# Patient Record
Sex: Male | Born: 1950 | Race: White | Hispanic: No | Marital: Married | Smoking: Former smoker
Health system: Southern US, Community
[De-identification: ages and names within clinical notes are randomized; demographics above are authoritative.]

## PROBLEM LIST (undated history)

## (undated) DIAGNOSIS — G2581 Restless legs syndrome: Secondary | ICD-10-CM

## (undated) DIAGNOSIS — I10 Essential (primary) hypertension: Secondary | ICD-10-CM

## (undated) DIAGNOSIS — F5221 Male erectile disorder: Secondary | ICD-10-CM

## (undated) DIAGNOSIS — Z87438 Personal history of other diseases of male genital organs: Secondary | ICD-10-CM

## (undated) DIAGNOSIS — N400 Enlarged prostate without lower urinary tract symptoms: Secondary | ICD-10-CM

## (undated) DIAGNOSIS — M545 Low back pain, unspecified: Secondary | ICD-10-CM

## (undated) DIAGNOSIS — R413 Other amnesia: Secondary | ICD-10-CM

## (undated) DIAGNOSIS — R42 Dizziness and giddiness: Secondary | ICD-10-CM

## (undated) DIAGNOSIS — G479 Sleep disorder, unspecified: Secondary | ICD-10-CM

## (undated) DIAGNOSIS — R7301 Impaired fasting glucose: Secondary | ICD-10-CM

## (undated) DIAGNOSIS — I998 Other disorder of circulatory system: Secondary | ICD-10-CM

## (undated) DIAGNOSIS — U071 COVID-19: Secondary | ICD-10-CM

## (undated) DIAGNOSIS — E7800 Pure hypercholesterolemia, unspecified: Secondary | ICD-10-CM

## (undated) DIAGNOSIS — M1990 Unspecified osteoarthritis, unspecified site: Secondary | ICD-10-CM

## (undated) DIAGNOSIS — E038 Other specified hypothyroidism: Secondary | ICD-10-CM

## (undated) DIAGNOSIS — F419 Anxiety disorder, unspecified: Secondary | ICD-10-CM

## (undated) DIAGNOSIS — F321 Major depressive disorder, single episode, moderate: Secondary | ICD-10-CM

## (undated) DIAGNOSIS — R251 Tremor, unspecified: Secondary | ICD-10-CM

## (undated) DIAGNOSIS — K5792 Diverticulitis of intestine, part unspecified, without perforation or abscess without bleeding: Secondary | ICD-10-CM

## (undated) HISTORY — DX: Other amnesia: R41.3

## (undated) HISTORY — DX: Anxiety disorder, unspecified: F41.9

## (undated) HISTORY — DX: Male erectile disorder: F52.21

## (undated) HISTORY — DX: Impaired fasting glucose: R73.01

## (undated) HISTORY — DX: Other specified hypothyroidism: E03.8

## (undated) HISTORY — DX: Sleep disorder, unspecified: G47.9

## (undated) HISTORY — DX: Benign prostatic hyperplasia without lower urinary tract symptoms: N40.0

## (undated) HISTORY — DX: Restless legs syndrome: G25.81

## (undated) HISTORY — DX: COVID-19: U07.1

## (undated) HISTORY — DX: Personal history of other diseases of male genital organs: Z87.438

## (undated) HISTORY — DX: Pure hypercholesterolemia, unspecified: E78.00

## (undated) HISTORY — DX: Other disorder of circulatory system: I99.8

## (undated) HISTORY — DX: Dizziness and giddiness: R42

## (undated) HISTORY — DX: Major depressive disorder, single episode, moderate: F32.1

## (undated) HISTORY — DX: Essential (primary) hypertension: I10

## (undated) HISTORY — DX: Diverticulitis of intestine, part unspecified, without perforation or abscess without bleeding: K57.92

## (undated) HISTORY — DX: Low back pain, unspecified: M54.50

## (undated) HISTORY — DX: Tremor, unspecified: R25.1

---

## 2012-02-01 ENCOUNTER — Emergency Department (HOSPITAL_COMMUNITY)
Admission: EM | Admit: 2012-02-01 | Discharge: 2012-02-02 | Disposition: A | Discharge status: Home / Self Care | Payer: BC Managed Care – PPO | Attending: Emergency Medicine | Admitting: Emergency Medicine

## 2012-02-01 ENCOUNTER — Encounter (HOSPITAL_COMMUNITY): Payer: Self-pay | Admitting: *Deleted

## 2012-02-01 DIAGNOSIS — Y929 Unspecified place or not applicable: Secondary | ICD-10-CM | POA: Insufficient documentation

## 2012-02-01 DIAGNOSIS — Z23 Encounter for immunization: Secondary | ICD-10-CM | POA: Insufficient documentation

## 2012-02-01 DIAGNOSIS — IMO0001 Reserved for inherently not codable concepts without codable children: Secondary | ICD-10-CM | POA: Insufficient documentation

## 2012-02-01 DIAGNOSIS — S61209A Unspecified open wound of unspecified finger without damage to nail, initial encounter: Secondary | ICD-10-CM | POA: Insufficient documentation

## 2012-02-01 DIAGNOSIS — Y9389 Activity, other specified: Secondary | ICD-10-CM | POA: Insufficient documentation

## 2012-02-01 DIAGNOSIS — W5581XA Bitten by other mammals, initial encounter: Secondary | ICD-10-CM

## 2012-02-01 MED ORDER — TETANUS-DIPHTH-ACELL PERTUSSIS 5-2.5-18.5 LF-MCG/0.5 IM SUSP
0.5000 mL | Freq: Once | INTRAMUSCULAR | Status: AC
  Administered 2012-02-02: 0.5 mL via INTRAMUSCULAR
  Filled 2012-02-01: qty 0.5

## 2012-02-01 MED ORDER — RABIES IMMUNE GLOBULIN 150 UNIT/ML IM INJ
20.0000 [IU]/kg | INJECTION | Freq: Once | INTRAMUSCULAR | Status: AC
  Administered 2012-02-02: 1800 [IU] via INTRAMUSCULAR
  Filled 2012-02-01: qty 12

## 2012-02-01 MED ORDER — RABIES VACCINE, PCEC IM SUSR
1.0000 mL | Freq: Once | INTRAMUSCULAR | Status: AC
  Administered 2012-02-02: 1 mL via INTRAMUSCULAR
  Filled 2012-02-01: qty 1

## 2012-02-01 MED ORDER — AMOXICILLIN-POT CLAVULANATE 875-125 MG PO TABS: 1.0000 | ORAL_TABLET | Freq: Two times a day (BID) | ORAL | Status: DC

## 2012-02-01 NOTE — ED Notes (Signed)
The pt was bitten by a bat on his rt index finger when he attempted to remove it from his house.  No pain

## 2012-02-02 NOTE — ED Provider Notes (Signed)
History     CSN: [id number]  Arrival date & time 02/01/12  2205   First MD Initiated Contact with Patient 02/01/12 2235      Chief Complaint  Patient presents with  . bat bite     (Consider location/radiation/quality/duration/timing/severity/associated sxs/prior treatment) HPI History provided by pt.   Pt left his sliding glass door open, a bat flew into kitchen, he knocked it down with a t-shirt and it bit him on right index finger, through the t-shirt.  The patient was startled and threw the bat back outside.  He has a small, minimally painful, hemostatic, puncture wound.  No associated paresthesias.  Last tetanus unknown.   History reviewed. No pertinent past medical history.  History reviewed. No pertinent past surgical history.  No family history on file.  History  Substance Use Topics  . Smoking status: Never Smoker   . Smokeless tobacco: Not on file  . Alcohol Use: No      Review of Systems  All other systems reviewed and are negative.    Allergies  Review of patient's allergies indicates no known allergies.  Home Medications   Current Outpatient Rx  Name Route Sig Dispense Refill  . FLUOXETINE HCL 40 MG PO CAPS Oral Take 80 mg by mouth every morning.    Marland Kitchen GEMFIBROZIL 600 MG PO TABS Oral Take 1,200 mg by mouth every evening.    Marland Kitchen TAMSULOSIN HCL 0.4 MG PO CAPS Oral Take 0.4 mg by mouth 2 (two) times daily.    . AMOXICILLIN-POT CLAVULANATE 875-125 MG PO TABS Oral Take 1 tablet by mouth every 12 (twelve) hours. 14 tablet 0    BP 155/89  Pulse 61  Temp 98.3 F (36.8 C) (Oral)  Resp 20  Ht 5' 9.5" (1.765 m)  Wt 202 lb (91.627 kg)  BMI 29.40 kg/m2  SpO2 97%  Physical Exam  Nursing note and vitals reviewed. Constitutional: He is oriented to person, place, and time. He appears well-developed and well-nourished. No distress.  HENT:  Head: Normocephalic and atraumatic.  Eyes:       Normal appearance  Neck: Normal range of motion.  Pulmonary/Chest:  Effort normal.  Musculoskeletal: Normal range of motion.       Pinpoint puncture wound on medial surface of middle phalanx of R index finger.  Clean and hemostatic.  Full active ROM all joints and distal sensation intact.    Neurological: He is alert and oriented to person, place, and time.  Psychiatric: He has a normal mood and affect. His behavior is normal.    ED Course  Procedures (including critical care time)  Labs Reviewed - No data to display No results found.   1. Bat bite of finger       MDM  Pt presents w/ bat bite to right index finger.  Wound cleaned by nursing staff.  Tetanus updated and rabies vaccine and immunoglobulin administered. Pt d/c'd home w/ abx.  He will follow up with [facility] for scheduled rabies shots.  Return precautions discussed.         Arie Sabina [place], [place] 02/02/12 [phone]

## 2012-02-03 NOTE — ED Provider Notes (Signed)
Medical screening examination/treatment/procedure(s) were performed by non-physician practitioner and as supervising physician I was immediately available for consultation/collaboration.   Violet Seabury L Nathaniel Wakeley, MD 02/03/12 0756 

## 2012-02-04 ENCOUNTER — Emergency Department (HOSPITAL_COMMUNITY)
Admission: EM | Admit: 2012-02-04 | Discharge: 2012-02-04 | Disposition: A | Discharge status: Home / Self Care | Payer: BC Managed Care – PPO | Source: Home / Self Care

## 2012-02-04 ENCOUNTER — Encounter (HOSPITAL_COMMUNITY): Payer: Self-pay

## 2012-02-04 DIAGNOSIS — Z23 Encounter for immunization: Secondary | ICD-10-CM

## 2012-02-04 MED ORDER — RABIES VACCINE, PCEC IM SUSR
1.0000 mL | Freq: Once | INTRAMUSCULAR | Status: AC
  Administered 2012-02-04: 1 mL via INTRAMUSCULAR

## 2012-02-04 MED ORDER — RABIES VACCINE, PCEC IM SUSR
INTRAMUSCULAR | Status: AC
  Filled 2012-02-04: qty 1

## 2012-02-07 ENCOUNTER — Emergency Department (HOSPITAL_COMMUNITY)
Admission: EM | Admit: 2012-02-07 | Discharge: 2012-02-07 | Disposition: A | Discharge status: Home / Self Care | Payer: BC Managed Care – PPO | Source: Home / Self Care

## 2012-02-07 ENCOUNTER — Encounter (HOSPITAL_COMMUNITY): Payer: Self-pay | Admitting: *Deleted

## 2012-02-07 DIAGNOSIS — Z23 Encounter for immunization: Secondary | ICD-10-CM

## 2012-02-07 MED ORDER — RABIES VACCINE, PCEC IM SUSR
INTRAMUSCULAR | Status: AC
  Filled 2012-02-07: qty 1

## 2012-02-07 MED ORDER — RABIES VACCINE, PCEC IM SUSR
1.0000 mL | Freq: Once | INTRAMUSCULAR | Status: AC
  Administered 2012-02-07: 1 mL via INTRAMUSCULAR

## 2012-02-07 NOTE — ED Notes (Signed)
Pt  Here  For     For  The        Next  Rabies       Injection   Voices no  Symptoms

## 2012-02-14 ENCOUNTER — Encounter (HOSPITAL_COMMUNITY): Payer: Self-pay | Admitting: Emergency Medicine

## 2012-02-14 ENCOUNTER — Emergency Department (HOSPITAL_COMMUNITY)
Admission: EM | Admit: 2012-02-14 | Discharge: 2012-02-14 | Disposition: A | Discharge status: Home / Self Care | Payer: BC Managed Care – PPO | Source: Home / Self Care

## 2012-02-14 DIAGNOSIS — Z23 Encounter for immunization: Secondary | ICD-10-CM

## 2012-02-14 MED ORDER — RABIES VACCINE, PCEC IM SUSR
INTRAMUSCULAR | Status: AC
  Filled 2012-02-14: qty 1

## 2012-02-14 MED ORDER — RABIES VACCINE, PCEC IM SUSR
1.0000 mL | Freq: Once | INTRAMUSCULAR | Status: AC
  Administered 2012-02-14: 1 mL via INTRAMUSCULAR

## 2012-02-14 NOTE — ED Notes (Signed)
Pt here for rabies injection.   Voices no concerns. 

## 2018-07-31 DIAGNOSIS — H109 Unspecified conjunctivitis: Secondary | ICD-10-CM | POA: Diagnosis not present

## 2019-04-09 DIAGNOSIS — H10503 Unspecified blepharoconjunctivitis, bilateral: Secondary | ICD-10-CM | POA: Diagnosis not present

## 2019-04-23 DIAGNOSIS — E7800 Pure hypercholesterolemia, unspecified: Secondary | ICD-10-CM | POA: Diagnosis not present

## 2019-04-23 DIAGNOSIS — F419 Anxiety disorder, unspecified: Secondary | ICD-10-CM | POA: Diagnosis not present

## 2019-04-23 DIAGNOSIS — I10 Essential (primary) hypertension: Secondary | ICD-10-CM | POA: Diagnosis not present

## 2019-04-23 DIAGNOSIS — N400 Enlarged prostate without lower urinary tract symptoms: Secondary | ICD-10-CM | POA: Diagnosis not present

## 2019-05-10 ENCOUNTER — Ambulatory Visit: Payer: Medicare Other | Attending: Internal Medicine

## 2019-05-10 DIAGNOSIS — Z23 Encounter for immunization: Secondary | ICD-10-CM | POA: Insufficient documentation

## 2019-05-10 NOTE — Progress Notes (Signed)
   Covid-19 Vaccination Clinic  Name:  Wayne Lowe    MRN: [id number] DOB: 1950/05/27  05/10/2019  [name] was observed post Covid-19 immunization for 15 minutes without incidence. He was provided with Vaccine Information Sheet and instruction to access the V-Safe system.   [name] was instructed to call 911 with any severe reactions post vaccine: Marland Kitchen Difficulty breathing  . Swelling of your face and throat  . A fast heartbeat  . A bad rash all over your body  . Dizziness and weakness    Immunizations Administered    Name Date Dose VIS Date Route   Pfizer COVID-19 Vaccine 05/10/2019  4:58 PM 0.3 mL 03/13/2019 Intramuscular   Manufacturer: [employer], [employer]   Lot: [id number]   NDC: [id number]

## 2019-05-11 ENCOUNTER — Ambulatory Visit: Payer: Medicare Other

## 2019-05-19 DIAGNOSIS — H938X3 Other specified disorders of ear, bilateral: Secondary | ICD-10-CM | POA: Diagnosis not present

## 2019-05-19 DIAGNOSIS — R0981 Nasal congestion: Secondary | ICD-10-CM | POA: Diagnosis not present

## 2019-05-29 ENCOUNTER — Ambulatory Visit: Payer: Self-pay

## 2019-06-04 ENCOUNTER — Ambulatory Visit: Payer: Medicare Other | Attending: Internal Medicine

## 2019-06-04 DIAGNOSIS — Z23 Encounter for immunization: Secondary | ICD-10-CM | POA: Insufficient documentation

## 2019-06-04 NOTE — Progress Notes (Signed)
   Covid-19 Vaccination Clinic  Name:  Wayne Lowe    MRN: [id number] DOB: 1950/08/19  06/04/2019  [name] was observed post Covid-19 immunization for 15 minutes without incident. He was provided with Vaccine Information Sheet and instruction to access the V-Safe system.   [name] was instructed to call 911 with any severe reactions post vaccine: Marland Kitchen Difficulty breathing  . Swelling of face and throat  . A fast heartbeat  . A bad rash all over body  . Dizziness and weakness   Immunizations Administered    Name Date Dose VIS Date Route   Pfizer COVID-19 Vaccine 06/04/2019  1:17 PM 0.3 mL 03/13/2019 Intramuscular   Manufacturer: [employer], [employer]   Lot: [id number]   NDC: [address]-4

## 2019-06-16 DIAGNOSIS — Z0120 Encounter for dental examination and cleaning without abnormal findings: Secondary | ICD-10-CM | POA: Diagnosis not present

## 2019-08-06 DIAGNOSIS — N400 Enlarged prostate without lower urinary tract symptoms: Secondary | ICD-10-CM | POA: Diagnosis not present

## 2019-08-06 DIAGNOSIS — I10 Essential (primary) hypertension: Secondary | ICD-10-CM | POA: Diagnosis not present

## 2019-08-06 DIAGNOSIS — B999 Unspecified infectious disease: Secondary | ICD-10-CM | POA: Diagnosis not present

## 2019-08-06 DIAGNOSIS — E7800 Pure hypercholesterolemia, unspecified: Secondary | ICD-10-CM | POA: Diagnosis not present

## 2020-01-11 DIAGNOSIS — J310 Chronic rhinitis: Secondary | ICD-10-CM | POA: Diagnosis not present

## 2020-01-11 DIAGNOSIS — J343 Hypertrophy of nasal turbinates: Secondary | ICD-10-CM | POA: Diagnosis not present

## 2020-01-11 DIAGNOSIS — H6983 Other specified disorders of Eustachian tube, bilateral: Secondary | ICD-10-CM | POA: Diagnosis not present

## 2020-01-11 DIAGNOSIS — H6521 Chronic serous otitis media, right ear: Secondary | ICD-10-CM | POA: Diagnosis not present

## 2020-01-13 DIAGNOSIS — H9011 Conductive hearing loss, unilateral, right ear, with unrestricted hearing on the contralateral side: Secondary | ICD-10-CM | POA: Diagnosis not present

## 2020-01-13 DIAGNOSIS — H6521 Chronic serous otitis media, right ear: Secondary | ICD-10-CM | POA: Diagnosis not present

## 2020-01-13 DIAGNOSIS — H6981 Other specified disorders of Eustachian tube, right ear: Secondary | ICD-10-CM | POA: Diagnosis not present

## 2020-02-29 DIAGNOSIS — M25512 Pain in left shoulder: Secondary | ICD-10-CM | POA: Diagnosis not present

## 2020-04-13 DIAGNOSIS — M25512 Pain in left shoulder: Secondary | ICD-10-CM | POA: Diagnosis not present

## 2020-04-18 DIAGNOSIS — M25512 Pain in left shoulder: Secondary | ICD-10-CM | POA: Diagnosis not present

## 2020-04-21 DIAGNOSIS — M25512 Pain in left shoulder: Secondary | ICD-10-CM | POA: Diagnosis not present

## 2020-04-22 DIAGNOSIS — M25511 Pain in right shoulder: Secondary | ICD-10-CM | POA: Diagnosis not present

## 2020-04-22 DIAGNOSIS — M25612 Stiffness of left shoulder, not elsewhere classified: Secondary | ICD-10-CM | POA: Diagnosis not present

## 2020-04-22 DIAGNOSIS — M6281 Muscle weakness (generalized): Secondary | ICD-10-CM | POA: Diagnosis not present

## 2020-04-22 DIAGNOSIS — M7502 Adhesive capsulitis of left shoulder: Secondary | ICD-10-CM | POA: Diagnosis not present

## 2020-06-01 DIAGNOSIS — E7800 Pure hypercholesterolemia, unspecified: Secondary | ICD-10-CM | POA: Diagnosis not present

## 2020-06-01 DIAGNOSIS — Z0001 Encounter for general adult medical examination with abnormal findings: Secondary | ICD-10-CM | POA: Diagnosis not present

## 2020-06-01 DIAGNOSIS — Z23 Encounter for immunization: Secondary | ICD-10-CM | POA: Diagnosis not present

## 2020-06-01 DIAGNOSIS — I10 Essential (primary) hypertension: Secondary | ICD-10-CM | POA: Diagnosis not present

## 2020-06-01 DIAGNOSIS — F321 Major depressive disorder, single episode, moderate: Secondary | ICD-10-CM | POA: Diagnosis not present

## 2021-01-10 DIAGNOSIS — H5789 Other specified disorders of eye and adnexa: Secondary | ICD-10-CM | POA: Diagnosis not present

## 2021-01-10 DIAGNOSIS — J069 Acute upper respiratory infection, unspecified: Secondary | ICD-10-CM | POA: Diagnosis not present

## 2021-03-03 ENCOUNTER — Ambulatory Visit
Admission: RE | Admit: 2021-03-03 | Discharge: 2021-03-03 | Disposition: A | Discharge status: Home / Self Care | Payer: Medicare Other | Source: Ambulatory Visit | Attending: Family Medicine | Admitting: Family Medicine

## 2021-03-03 ENCOUNTER — Other Ambulatory Visit: Payer: Self-pay | Admitting: Family Medicine

## 2021-03-03 DIAGNOSIS — R059 Cough, unspecified: Secondary | ICD-10-CM | POA: Diagnosis not present

## 2021-03-03 DIAGNOSIS — F321 Major depressive disorder, single episode, moderate: Secondary | ICD-10-CM | POA: Diagnosis not present

## 2021-03-03 DIAGNOSIS — Z23 Encounter for immunization: Secondary | ICD-10-CM | POA: Diagnosis not present

## 2021-03-27 DIAGNOSIS — Z0100 Encounter for examination of eyes and vision without abnormal findings: Secondary | ICD-10-CM | POA: Diagnosis not present

## 2021-03-27 DIAGNOSIS — H40029 Open angle with borderline findings, high risk, unspecified eye: Secondary | ICD-10-CM | POA: Diagnosis not present

## 2021-06-14 DIAGNOSIS — H401121 Primary open-angle glaucoma, left eye, mild stage: Secondary | ICD-10-CM | POA: Diagnosis not present

## 2021-06-14 DIAGNOSIS — H2511 Age-related nuclear cataract, right eye: Secondary | ICD-10-CM | POA: Diagnosis not present

## 2021-06-14 DIAGNOSIS — H401112 Primary open-angle glaucoma, right eye, moderate stage: Secondary | ICD-10-CM | POA: Diagnosis not present

## 2021-06-14 DIAGNOSIS — H2512 Age-related nuclear cataract, left eye: Secondary | ICD-10-CM | POA: Diagnosis not present

## 2021-07-10 DIAGNOSIS — H401112 Primary open-angle glaucoma, right eye, moderate stage: Secondary | ICD-10-CM | POA: Diagnosis not present

## 2021-07-10 DIAGNOSIS — H2511 Age-related nuclear cataract, right eye: Secondary | ICD-10-CM | POA: Diagnosis not present

## 2021-08-22 DIAGNOSIS — F419 Anxiety disorder, unspecified: Secondary | ICD-10-CM | POA: Diagnosis not present

## 2021-08-22 DIAGNOSIS — I10 Essential (primary) hypertension: Secondary | ICD-10-CM | POA: Diagnosis not present

## 2021-08-22 DIAGNOSIS — Z79899 Other long term (current) drug therapy: Secondary | ICD-10-CM | POA: Diagnosis not present

## 2021-08-22 DIAGNOSIS — Z125 Encounter for screening for malignant neoplasm of prostate: Secondary | ICD-10-CM | POA: Diagnosis not present

## 2021-08-22 DIAGNOSIS — N400 Enlarged prostate without lower urinary tract symptoms: Secondary | ICD-10-CM | POA: Diagnosis not present

## 2021-08-22 DIAGNOSIS — E7800 Pure hypercholesterolemia, unspecified: Secondary | ICD-10-CM | POA: Diagnosis not present

## 2021-09-04 DIAGNOSIS — H2512 Age-related nuclear cataract, left eye: Secondary | ICD-10-CM | POA: Diagnosis not present

## 2021-09-04 DIAGNOSIS — H401121 Primary open-angle glaucoma, left eye, mild stage: Secondary | ICD-10-CM | POA: Diagnosis not present

## 2021-09-04 DIAGNOSIS — H409 Unspecified glaucoma: Secondary | ICD-10-CM | POA: Diagnosis not present

## 2021-12-12 DIAGNOSIS — E038 Other specified hypothyroidism: Secondary | ICD-10-CM | POA: Diagnosis not present

## 2021-12-12 DIAGNOSIS — Z23 Encounter for immunization: Secondary | ICD-10-CM | POA: Diagnosis not present

## 2022-05-08 DIAGNOSIS — N5082 Scrotal pain: Secondary | ICD-10-CM | POA: Diagnosis not present

## 2022-05-08 DIAGNOSIS — R972 Elevated prostate specific antigen [PSA]: Secondary | ICD-10-CM | POA: Diagnosis not present

## 2022-05-08 DIAGNOSIS — N5201 Erectile dysfunction due to arterial insufficiency: Secondary | ICD-10-CM | POA: Diagnosis not present

## 2022-05-08 DIAGNOSIS — R350 Frequency of micturition: Secondary | ICD-10-CM | POA: Diagnosis not present

## 2022-05-08 DIAGNOSIS — N4232 Atypical small acinar proliferation of prostate: Secondary | ICD-10-CM | POA: Diagnosis not present

## 2022-06-12 DIAGNOSIS — N5201 Erectile dysfunction due to arterial insufficiency: Secondary | ICD-10-CM | POA: Diagnosis not present

## 2022-07-27 DIAGNOSIS — H524 Presbyopia: Secondary | ICD-10-CM | POA: Diagnosis not present

## 2022-07-27 DIAGNOSIS — H401131 Primary open-angle glaucoma, bilateral, mild stage: Secondary | ICD-10-CM | POA: Diagnosis not present

## 2022-08-03 DIAGNOSIS — F321 Major depressive disorder, single episode, moderate: Secondary | ICD-10-CM | POA: Diagnosis not present

## 2022-08-03 DIAGNOSIS — Z79899 Other long term (current) drug therapy: Secondary | ICD-10-CM | POA: Diagnosis not present

## 2022-08-03 DIAGNOSIS — I10 Essential (primary) hypertension: Secondary | ICD-10-CM | POA: Diagnosis not present

## 2022-08-03 DIAGNOSIS — E7800 Pure hypercholesterolemia, unspecified: Secondary | ICD-10-CM | POA: Diagnosis not present

## 2022-08-03 DIAGNOSIS — F419 Anxiety disorder, unspecified: Secondary | ICD-10-CM | POA: Diagnosis not present

## 2022-08-22 DIAGNOSIS — M542 Cervicalgia: Secondary | ICD-10-CM | POA: Diagnosis not present

## 2022-09-26 DIAGNOSIS — H401112 Primary open-angle glaucoma, right eye, moderate stage: Secondary | ICD-10-CM | POA: Diagnosis not present

## 2022-10-24 ENCOUNTER — Encounter (HOSPITAL_BASED_OUTPATIENT_CLINIC_OR_DEPARTMENT_OTHER): Payer: Self-pay

## 2022-10-24 ENCOUNTER — Ambulatory Visit (HOSPITAL_BASED_OUTPATIENT_CLINIC_OR_DEPARTMENT_OTHER)
Admission: RE | Admit: 2022-10-24 | Discharge: 2022-10-24 | Disposition: A | Discharge status: Home / Self Care | Payer: Medicare Other | Source: Ambulatory Visit | Attending: Family Medicine | Admitting: Family Medicine

## 2022-10-24 ENCOUNTER — Other Ambulatory Visit (HOSPITAL_COMMUNITY): Payer: Self-pay | Admitting: Family Medicine

## 2022-10-24 DIAGNOSIS — R1084 Generalized abdominal pain: Secondary | ICD-10-CM | POA: Diagnosis not present

## 2022-10-24 DIAGNOSIS — R109 Unspecified abdominal pain: Secondary | ICD-10-CM | POA: Insufficient documentation

## 2022-10-24 DIAGNOSIS — K5732 Diverticulitis of large intestine without perforation or abscess without bleeding: Secondary | ICD-10-CM | POA: Diagnosis not present

## 2022-10-24 LAB — POCT I-STAT CREATININE: Creatinine, Ser: 1 mg/dL (ref 0.61–1.24)

## 2022-10-24 MED ORDER — IOHEXOL 300 MG/ML  SOLN
100.0000 mL | Freq: Once | INTRAMUSCULAR | Status: AC | PRN
  Administered 2022-10-24: 100 mL via INTRAVENOUS

## 2022-12-27 DIAGNOSIS — N3001 Acute cystitis with hematuria: Secondary | ICD-10-CM | POA: Diagnosis not present

## 2022-12-27 DIAGNOSIS — R310 Gross hematuria: Secondary | ICD-10-CM | POA: Diagnosis not present

## 2022-12-27 DIAGNOSIS — N390 Urinary tract infection, site not specified: Secondary | ICD-10-CM | POA: Diagnosis not present

## 2023-01-02 DIAGNOSIS — G2581 Restless legs syndrome: Secondary | ICD-10-CM | POA: Diagnosis not present

## 2023-01-02 DIAGNOSIS — R413 Other amnesia: Secondary | ICD-10-CM | POA: Diagnosis not present

## 2023-01-02 DIAGNOSIS — R7301 Impaired fasting glucose: Secondary | ICD-10-CM | POA: Diagnosis not present

## 2023-01-02 DIAGNOSIS — Z23 Encounter for immunization: Secondary | ICD-10-CM | POA: Diagnosis not present

## 2023-01-02 DIAGNOSIS — R42 Dizziness and giddiness: Secondary | ICD-10-CM | POA: Diagnosis not present

## 2023-01-08 ENCOUNTER — Other Ambulatory Visit: Payer: Self-pay | Admitting: Family Medicine

## 2023-01-08 DIAGNOSIS — R413 Other amnesia: Secondary | ICD-10-CM

## 2023-01-09 DIAGNOSIS — R300 Dysuria: Secondary | ICD-10-CM | POA: Diagnosis not present

## 2023-01-10 ENCOUNTER — Ambulatory Visit
Admission: RE | Admit: 2023-01-10 | Discharge: 2023-01-10 | Disposition: A | Discharge status: Home / Self Care | Payer: Medicare Other | Source: Ambulatory Visit | Attending: Family Medicine | Admitting: Family Medicine

## 2023-01-10 DIAGNOSIS — R413 Other amnesia: Secondary | ICD-10-CM

## 2023-01-10 DIAGNOSIS — H539 Unspecified visual disturbance: Secondary | ICD-10-CM | POA: Diagnosis not present

## 2023-01-10 DIAGNOSIS — R251 Tremor, unspecified: Secondary | ICD-10-CM | POA: Diagnosis not present

## 2023-01-10 DIAGNOSIS — R410 Disorientation, unspecified: Secondary | ICD-10-CM | POA: Diagnosis not present

## 2023-01-21 ENCOUNTER — Ambulatory Visit: Payer: Medicare Other | Attending: Internal Medicine | Admitting: Internal Medicine

## 2023-01-21 ENCOUNTER — Encounter: Payer: Self-pay | Admitting: Internal Medicine

## 2023-01-21 ENCOUNTER — Ambulatory Visit: Payer: Medicare Other | Attending: Internal Medicine

## 2023-01-21 VITALS — BP 144/79 | HR 54 | Ht 70.0 in | Wt 205.0 lb

## 2023-01-21 DIAGNOSIS — R002 Palpitations: Secondary | ICD-10-CM

## 2023-01-21 DIAGNOSIS — R9431 Abnormal electrocardiogram [ECG] [EKG]: Secondary | ICD-10-CM

## 2023-01-21 DIAGNOSIS — R42 Dizziness and giddiness: Secondary | ICD-10-CM | POA: Diagnosis not present

## 2023-01-21 MED ORDER — AMLODIPINE BESYLATE 2.5 MG PO TABS: 2.5000 mg | ORAL_TABLET | Freq: Two times a day (BID) | ORAL | 3 refills | Status: DC

## 2023-01-21 NOTE — Progress Notes (Addendum)
Cardiology Office Note   Date:  01/21/2023   ID:  Wayne Lowe, DOB [birth year], MRN [id number]  PCP:  Judyann Munson, MD  Cardiologist:   Dietrich Pates, MD   Pt presents for evaluation of dizziness    History of Present Illness: Wayne Lowe is a 72 y.o. 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   amLODipine (NORVASC) 2.5 MG tablet Take 2.5 mg by mouth daily.   amoxicillin-clavulanate (AUGMENTIN) 875-125 MG per tablet Take 1 tablet by mouth every 12 (twelve) hours.   aspirin EC 81 MG tablet Take 81 mg by mouth daily. Swallow whole.   buPROPion (ZYBAN) 150 MG 12 hr tablet Take 150 mg by mouth 2 (two) times daily.   cetirizine (ZYRTEC) 10 MG chewable tablet Chew 10 mg by mouth daily.   finasteride (PROSCAR) 5 MG tablet Take 5 mg by mouth daily.   FLUoxetine (PROZAC) 40 MG capsule Take 80 mg by mouth every morning.   fluticasone (FLONASE) 50 MCG/ACT nasal spray Place 1 spray into both nostrils daily.   gemfibrozil (LOPID) 600 MG tablet Take 1,200 mg by mouth every evening.   levocetirizine (XYZAL) 5 MG tablet Take 5 mg by mouth every evening.   omeprazole (PRILOSEC) 40 MG capsule Take 40 mg by mouth daily.   sildenafil (REVATIO) 20 MG tablet Take 20 mg by mouth 3 (three) times daily.   tadalafil (CIALIS) 20 MG tablet Take 20 mg by mouth daily as needed for erectile dysfunction.   Tamsulosin HCl (FLOMAX) 0.4 MG CAPS Take 0.4 mg by mouth 2 (two) times  daily.   traZODone (DESYREL) 100 MG tablet Take 100 mg by mouth at bedtime.   trimethoprim-polymyxin b (POLYTRIM) ophthalmic solution Place 1 drop into both eyes every 4 (four) hours.     Allergies:   Buspar [buspirone], Crestor [rosuvastatin], Erythromycin, Lipitor [atorvastatin], Lovastatin, Pravachol [pravastatin], and Wellbutrin [bupropion]   Past Medical History:  Diagnosis Date   Anxiety    COVID-19    Diverticulitis    Dizziness    ED (erectile dysfunction) of non-organic origin    Enlarged prostate without lower urinary tract symptoms (luts)    Fluctuating blood pressure    History of BPH    HTN (hypertension)    Hypercholesteremia    Impaired fasting glucose    Memory changes    Moderate major depression (HCC)    Recurrent low back pain    Restless leg    Sleep disorder    Tremor    TSH deficiency     No past surgical history on file.   Social History:  The patient  reports that he has quit smoking. His smoking use included cigarettes. He does not have any smokeless tobacco history on file. He reports that  he does not drink alcohol.   Family History:  The patient's father died with CHF,DM  Brother with CHF  Mother with CHF    ROS:  Please see the history of present illness. All other systems are reviewed and  Negative to the above problem except as noted.    PHYSICAL EXAM: VS:  BP (!) 144/79   Pulse (!) 54   Ht 5\' 10"  (1.778 m)   Wt 205 lb (93 kg)   SpO2 99%   BMI 29.41 kg/m    Orthostatics:  BP 144/79  P 53  Sitting   143/82  P 61  Standing 127/79  P 65  Standing 4 min 149/84  P 63  GEN: Well nourished, well developed, in no acute distress  HEENT: normal  Neck: no JVD, carotid bruit Cardiac: RRR; no murmur  No LE edema  Respiratory:  clear to auscultation bilaterally,  GI: soft, nontender  No  Ext  No edema    EKG:  EKG is ordered today.  SB 54 bpm   LVH   nonspecific ST changes    Lipid Panel No results found for: "CHOL", "TRIG", "HDL",  "CHOLHDL", "VLDL", "LDLCALC", "LDLDIRECT"    Wt Readings from Last 3 Encounters:  01/21/23 205 lb (93 kg)  02/01/12 202 lb (91.6 kg)      ASSESSMENT AND PLAN:  1  Dizziness  Pt with episodes of dizziness with standing  He admits to not drinking enough fluids at times   Recomm he hydrate    Will set up for Zio patch to r/o signifcant bardycardia  2  HTN  BP is mildly increased   Increase amlodipine to bid 2.5 mg   3  HL   Pt on atorvastatin 20 mg   LDL 69 in May 2024  Continue   4  Atherosclerosis  Noted on CT scan   Rx risk factors   5  Metabolics    A1C 5.6   Discussed diet   Limit carbs, juices   Increase walking    6  FHx of CHF   Parents with CHF  Brother with CHF.   Will set up for echo to evaluate LV function     Current medicines are reviewed at length with the patient today.  The patient does not have concerns regarding medicines.  Signed, Dietrich Pates, MD  01/21/2023 9:22 AM    [facility] [address] [place], [place], [place]  [address] Phone: [phone]; Fax: [phone]

## 2023-01-21 NOTE — Patient Instructions (Addendum)
Medication Instructions:  Increase Amlodipine to twice a day  *If you need a refill on your cardiac medications before your next appointment, please call your pharmacy*   Lab Work:  If you have labs (blood work) drawn today and your tests are completely normal, you will receive your results only by: MyChart Message (if you have MyChart) OR A paper copy in the mail If you have any lab test that is abnormal or we need to change your treatment, we will call you to review the results.   Testing/Procedures: Your physician has requested that you have an echocardiogram. Echocardiography is a painless test that uses sound waves to create images of your heart. It provides your doctor with information about the size and shape of your heart and how well your heart's chambers and valves are working. This procedure takes approximately one hour. There are no restrictions for this procedure. Please do NOT wear cologne, perfume, aftershave, or lotions (deodorant is allowed). Please arrive 15 minutes prior to your appointment time.  ZIO XT- Long Term Monitor Instructions  Your physician has requested you wear a ZIO patch monitor for 7 days.  This is a single patch monitor. [employer] supplies one patch monitor per enrollment. Additional stickers are not available. Please do not apply patch if you will be having a Nuclear Stress Test,  Echocardiogram, Cardiac CT, MRI, or Chest Xray during the period you would be wearing the  monitor. The patch cannot be worn during these tests. You cannot remove and re-apply the  ZIO XT patch monitor.  Your ZIO patch monitor will be mailed 3 day USPS to your address on file. It may take 3-5 days  to receive your monitor after you have been enrolled.  Once you have received your monitor, please review the enclosed instructions. Your monitor  has already been registered assigning a specific monitor serial # to you.  Billing and Patient Assistance Program Information  We  have supplied [employer] with any of your insurance information on file for billing purposes. [employer] offers a sliding scale Patient Assistance Program for patients that do not have  insurance, or whose insurance does not completely cover the cost of the ZIO monitor.  You must apply for the Patient Assistance Program to qualify for this discounted rate.  To apply, please call [employer] at [phone], select option 4, select option 2, ask to apply for  Patient Assistance Program. Meredeth Ide will ask your household income, and how many people  are in your household. They will quote your out-of-pocket cost based on that information.  [employer] will also be able to set up a 76-month, interest-free payment plan if needed.  Applying the monitor   Shave hair from upper left chest.  Hold abrader disc by orange tab. Rub abrader in 40 strokes over the upper left chest as  indicated in your monitor instructions.  Clean area with 4 enclosed alcohol pads. Let dry.  Apply patch as indicated in monitor instructions. Patch will be placed under collarbone on left  side of chest with arrow pointing upward.  Rub patch adhesive wings for 2 minutes. Remove white label marked "1". Remove the white  label marked "2". Rub patch adhesive wings for 2 additional minutes.  While looking in a mirror, press and release button in center of patch. A small green light will  flash 3-4 times. This will be your only indicator that the monitor has been turned on.  Do not shower for the first 24 hours. You may  shower after the first 24 hours.  Press the button if you feel a symptom. You will hear a small click. Record Date, Time and  Symptom in the Patient Logbook.  When you are ready to remove the patch, follow instructions on the last 2 pages of Patient  Logbook. Stick patch monitor onto the last page of Patient Logbook.  Place Patient Logbook in the blue and white box. Use locking tab on box and tape box closed  securely. The blue  and white box has prepaid postage on it. Please place it in the mailbox as  soon as possible. Your physician should have your test results approximately 7 days after the  monitor has been mailed back to [facility].  Call [facility] Customer Care at [phone] if you have questions regarding  your ZIO XT patch monitor. Call them immediately if you see an orange light blinking on your  monitor.  If your monitor falls off in less than 4 days, contact our Monitor department at [phone].  If your monitor becomes loose or falls off after 4 days call [employer] at [phone] for  suggestions on securing your monitor    Follow-Up: At [facility], you and your health needs are our priority.  As part of our continuing mission to provide you with exceptional heart care, we have created designated Provider Care Teams.  These Care Teams include your primary Cardiologist (physician) and Advanced Practice Providers (APPs -  Physician Assistants and Nurse Practitioners) who all work together to provide you with the care you need, when you need it.  We recommend signing up for the patient portal called "MyChart".  Sign up information is provided on this After Visit Summary.  MyChart is used to connect with patients for Virtual Visits (Telemedicine).  Patients are able to view lab/test results, encounter notes, upcoming appointments, etc.  Non-urgent messages can be sent to your provider as well.   To learn more about what you can do with MyChart, go to [email or web address].    Your next appointment:   5 month(s) March 2025  Provider:   Dr Dietrich Pates If Card or EP not listed click to update   DO NOT delete brackets or number around this link :1}   Other Instructions

## 2023-01-21 NOTE — Progress Notes (Unsigned)
Enrolled for [employer] to mail a ZIO XT long term holter monitor to the patients address on file.  

## 2023-01-22 DIAGNOSIS — N281 Cyst of kidney, acquired: Secondary | ICD-10-CM | POA: Diagnosis not present

## 2023-01-22 DIAGNOSIS — N5082 Scrotal pain: Secondary | ICD-10-CM | POA: Diagnosis not present

## 2023-01-22 DIAGNOSIS — N5201 Erectile dysfunction due to arterial insufficiency: Secondary | ICD-10-CM | POA: Diagnosis not present

## 2023-01-22 DIAGNOSIS — R350 Frequency of micturition: Secondary | ICD-10-CM | POA: Diagnosis not present

## 2023-01-22 DIAGNOSIS — R972 Elevated prostate specific antigen [PSA]: Secondary | ICD-10-CM | POA: Diagnosis not present

## 2023-01-26 DIAGNOSIS — R002 Palpitations: Secondary | ICD-10-CM | POA: Diagnosis not present

## 2023-01-26 DIAGNOSIS — R9431 Abnormal electrocardiogram [ECG] [EKG]: Secondary | ICD-10-CM | POA: Diagnosis not present

## 2023-01-26 DIAGNOSIS — R42 Dizziness and giddiness: Secondary | ICD-10-CM

## 2023-02-12 DIAGNOSIS — R42 Dizziness and giddiness: Secondary | ICD-10-CM | POA: Diagnosis not present

## 2023-02-12 DIAGNOSIS — R002 Palpitations: Secondary | ICD-10-CM | POA: Diagnosis not present

## 2023-02-15 ENCOUNTER — Ambulatory Visit (HOSPITAL_COMMUNITY): Payer: Medicare Other | Attending: Internal Medicine

## 2023-02-15 DIAGNOSIS — R9431 Abnormal electrocardiogram [ECG] [EKG]: Secondary | ICD-10-CM | POA: Insufficient documentation

## 2023-02-15 DIAGNOSIS — R002 Palpitations: Secondary | ICD-10-CM | POA: Diagnosis not present

## 2023-02-15 DIAGNOSIS — R42 Dizziness and giddiness: Secondary | ICD-10-CM | POA: Diagnosis not present

## 2023-02-15 LAB — ECHOCARDIOGRAM COMPLETE
AR max vel: 2.54 cm2
AV Area VTI: 2.49 cm2
AV Area mean vel: 2.38 cm2
AV Mean grad: 7 mm[Hg]
AV Peak grad: 14.2 mm[Hg]
Ao pk vel: 1.89 m/s
Area-P 1/2: 2.56 cm2
S' Lateral: 2.4 cm

## 2023-02-19 DIAGNOSIS — K5792 Diverticulitis of intestine, part unspecified, without perforation or abscess without bleeding: Secondary | ICD-10-CM | POA: Diagnosis not present

## 2023-03-23 IMAGING — DX DG CHEST 2V
2 series · 2 of 2 positions shown · non-contrast
Comparison: None.

CLINICAL DATA: Cough.

EXAM:
CHEST - 2 VIEW

[dg chest 2 view (1 of 2)]
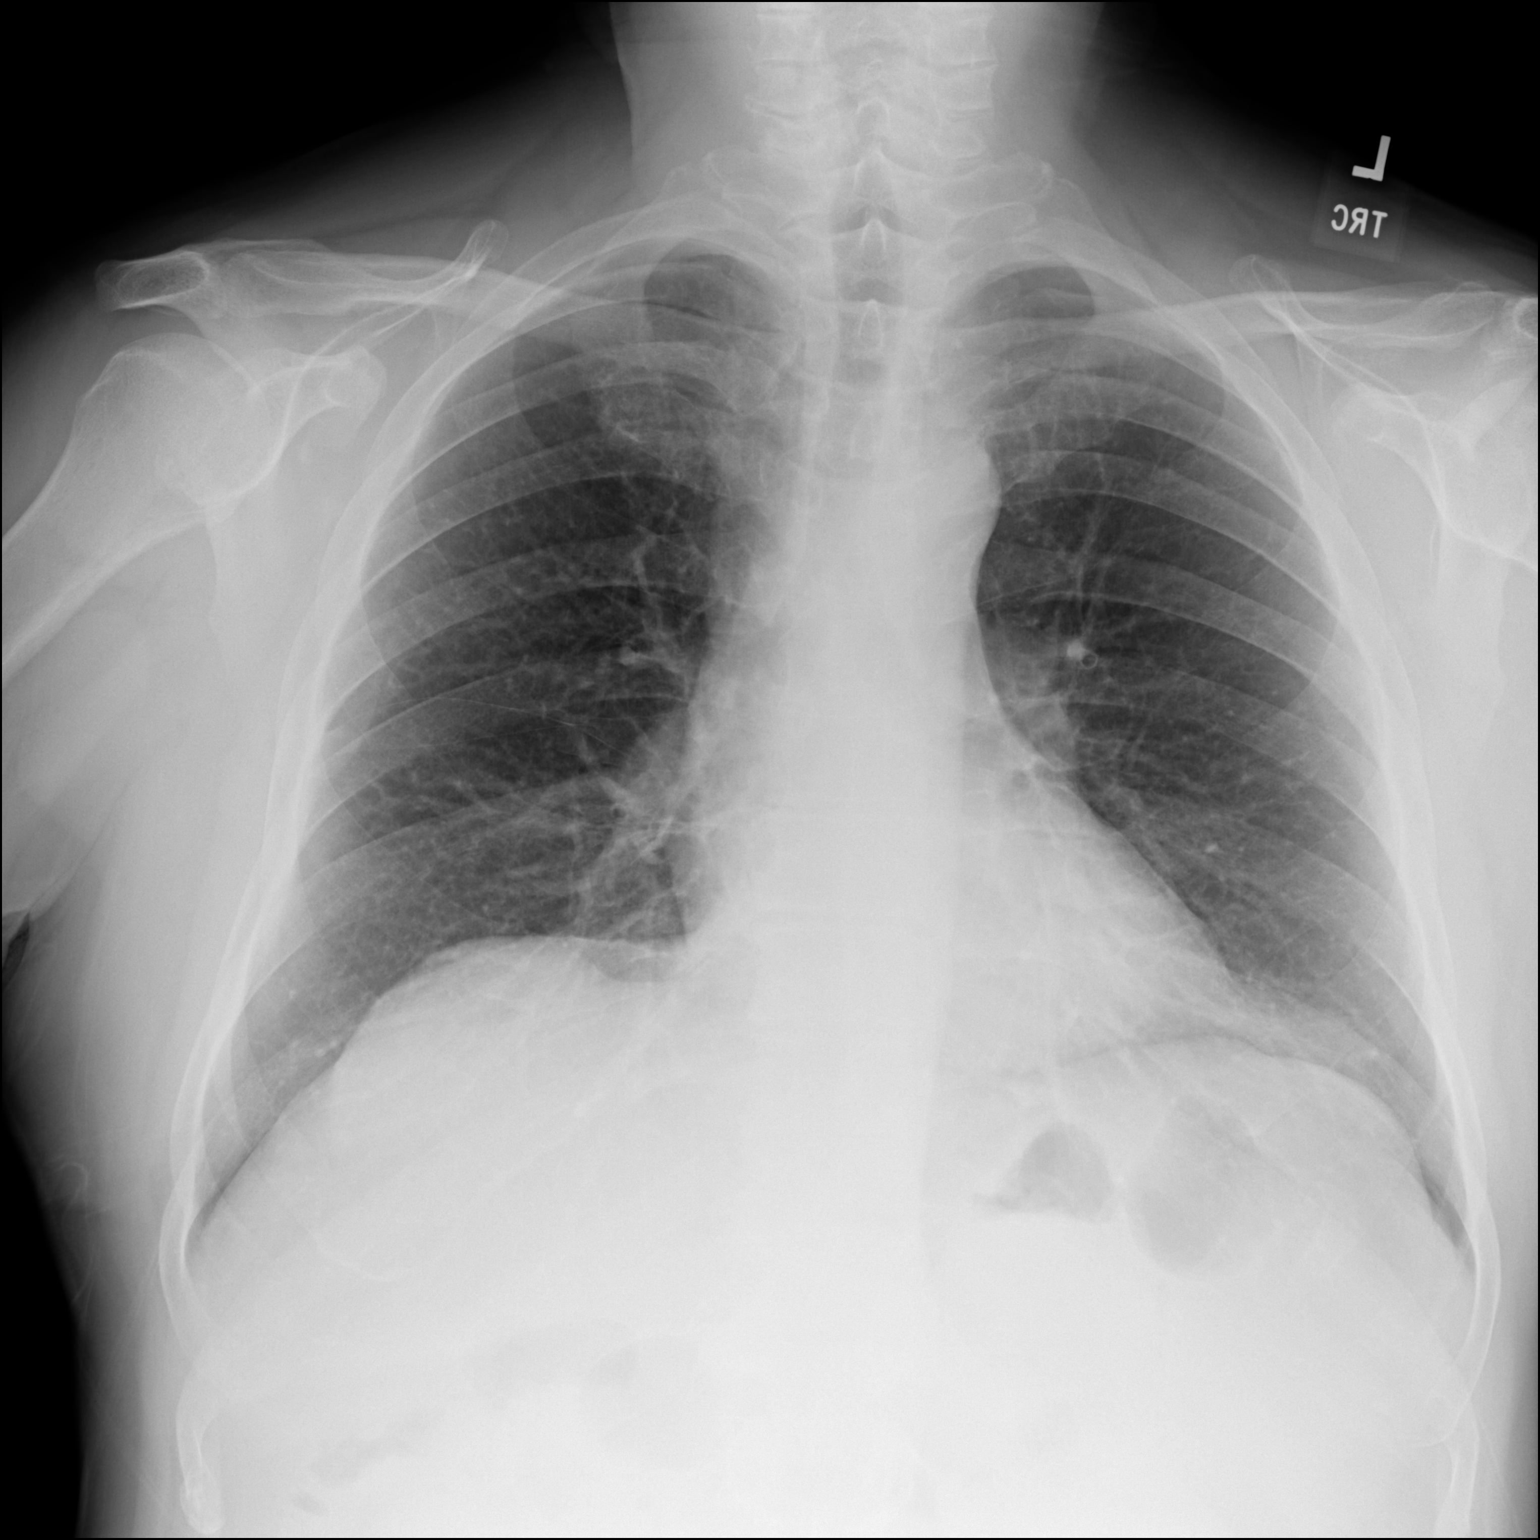

[dg chest 2 view (2 of 2)]
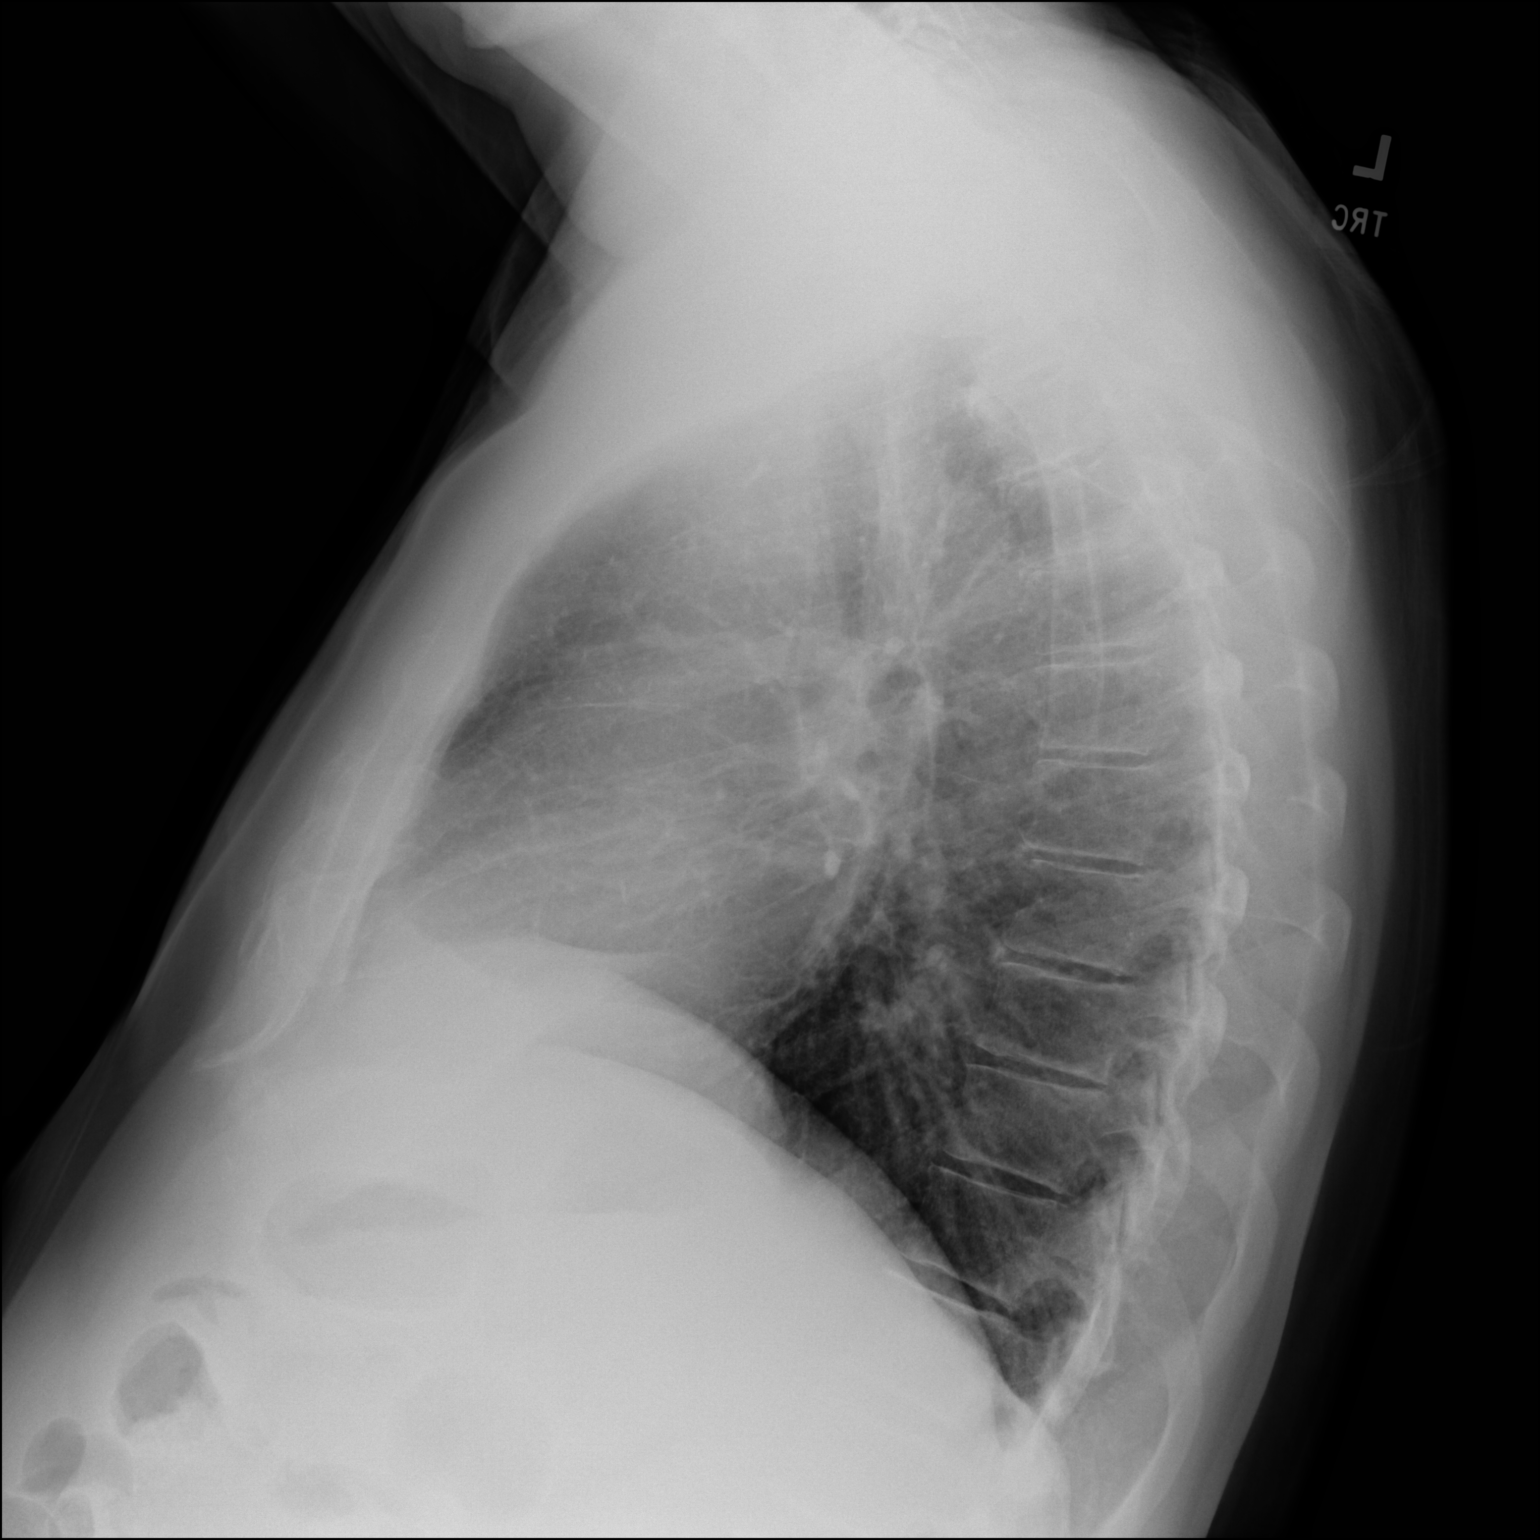

[2 of 2 positions shown; findings below may reference images not displayed]

FINDINGS: The heart size and mediastinal contours are within normal limits.
Both lungs are clear. The visualized skeletal structures are
unremarkable.
IMPRESSION: No active cardiopulmonary disease.

## 2023-04-16 ENCOUNTER — Encounter: Payer: Self-pay | Admitting: *Deleted

## 2023-04-17 ENCOUNTER — Ambulatory Visit: Payer: Medicare Other | Admitting: Diagnostic Neuroimaging

## 2023-06-01 ENCOUNTER — Inpatient Hospital Stay (HOSPITAL_COMMUNITY)
Admission: EM | Admit: 2023-06-01 | Discharge: 2023-06-10 | DRG: 330 | Disposition: A | Discharge status: Home Health | Attending: Internal Medicine | Admitting: Internal Medicine

## 2023-06-01 ENCOUNTER — Other Ambulatory Visit: Payer: Self-pay

## 2023-06-01 ENCOUNTER — Ambulatory Visit: Admission: EM | Admit: 2023-06-01 | Discharge: 2023-06-01 | Disposition: A | Discharge status: Home / Self Care

## 2023-06-01 ENCOUNTER — Emergency Department (HOSPITAL_COMMUNITY)

## 2023-06-01 DIAGNOSIS — D72829 Elevated white blood cell count, unspecified: Secondary | ICD-10-CM | POA: Diagnosis not present

## 2023-06-01 DIAGNOSIS — E876 Hypokalemia: Secondary | ICD-10-CM | POA: Diagnosis present

## 2023-06-01 DIAGNOSIS — Z79899 Other long term (current) drug therapy: Secondary | ICD-10-CM | POA: Diagnosis not present

## 2023-06-01 DIAGNOSIS — R933 Abnormal findings on diagnostic imaging of other parts of digestive tract: Secondary | ICD-10-CM | POA: Diagnosis not present

## 2023-06-01 DIAGNOSIS — Z4682 Encounter for fitting and adjustment of non-vascular catheter: Secondary | ICD-10-CM | POA: Diagnosis not present

## 2023-06-01 DIAGNOSIS — E872 Acidosis, unspecified: Secondary | ICD-10-CM | POA: Diagnosis not present

## 2023-06-01 DIAGNOSIS — K769 Liver disease, unspecified: Secondary | ICD-10-CM | POA: Diagnosis not present

## 2023-06-01 DIAGNOSIS — S2241XA Multiple fractures of ribs, right side, initial encounter for closed fracture: Secondary | ICD-10-CM | POA: Diagnosis present

## 2023-06-01 DIAGNOSIS — G2581 Restless legs syndrome: Secondary | ICD-10-CM | POA: Diagnosis not present

## 2023-06-01 DIAGNOSIS — Z87891 Personal history of nicotine dependence: Secondary | ICD-10-CM

## 2023-06-01 DIAGNOSIS — R140 Abdominal distension (gaseous): Secondary | ICD-10-CM

## 2023-06-01 DIAGNOSIS — Z8616 Personal history of COVID-19: Secondary | ICD-10-CM

## 2023-06-01 DIAGNOSIS — E7800 Pure hypercholesterolemia, unspecified: Secondary | ICD-10-CM | POA: Diagnosis not present

## 2023-06-01 DIAGNOSIS — K56699 Other intestinal obstruction unspecified as to partial versus complete obstruction: Secondary | ICD-10-CM | POA: Diagnosis not present

## 2023-06-01 DIAGNOSIS — K644 Residual hemorrhoidal skin tags: Secondary | ICD-10-CM | POA: Diagnosis not present

## 2023-06-01 DIAGNOSIS — R451 Restlessness and agitation: Secondary | ICD-10-CM | POA: Diagnosis not present

## 2023-06-01 DIAGNOSIS — E039 Hypothyroidism, unspecified: Secondary | ICD-10-CM | POA: Diagnosis not present

## 2023-06-01 DIAGNOSIS — N321 Vesicointestinal fistula: Secondary | ICD-10-CM | POA: Diagnosis present

## 2023-06-01 DIAGNOSIS — K5732 Diverticulitis of large intestine without perforation or abscess without bleeding: Secondary | ICD-10-CM | POA: Diagnosis not present

## 2023-06-01 DIAGNOSIS — K56600 Partial intestinal obstruction, unspecified as to cause: Secondary | ICD-10-CM | POA: Diagnosis not present

## 2023-06-01 DIAGNOSIS — R339 Retention of urine, unspecified: Secondary | ICD-10-CM | POA: Diagnosis not present

## 2023-06-01 DIAGNOSIS — R1084 Generalized abdominal pain: Secondary | ICD-10-CM | POA: Diagnosis not present

## 2023-06-01 DIAGNOSIS — K5669 Other partial intestinal obstruction: Secondary | ICD-10-CM

## 2023-06-01 DIAGNOSIS — K5939 Other megacolon: Secondary | ICD-10-CM | POA: Diagnosis not present

## 2023-06-01 DIAGNOSIS — Z7982 Long term (current) use of aspirin: Secondary | ICD-10-CM | POA: Diagnosis not present

## 2023-06-01 DIAGNOSIS — R194 Change in bowel habit: Secondary | ICD-10-CM | POA: Diagnosis not present

## 2023-06-01 DIAGNOSIS — M545 Low back pain, unspecified: Secondary | ICD-10-CM | POA: Diagnosis present

## 2023-06-01 DIAGNOSIS — R9431 Abnormal electrocardiogram [ECG] [EKG]: Secondary | ICD-10-CM | POA: Diagnosis present

## 2023-06-01 DIAGNOSIS — R935 Abnormal findings on diagnostic imaging of other abdominal regions, including retroperitoneum: Secondary | ICD-10-CM | POA: Diagnosis not present

## 2023-06-01 DIAGNOSIS — W000XXA Fall on same level due to ice and snow, initial encounter: Secondary | ICD-10-CM | POA: Diagnosis present

## 2023-06-01 DIAGNOSIS — K5730 Diverticulosis of large intestine without perforation or abscess without bleeding: Secondary | ICD-10-CM | POA: Diagnosis not present

## 2023-06-01 DIAGNOSIS — I700 Atherosclerosis of aorta: Secondary | ICD-10-CM | POA: Diagnosis present

## 2023-06-01 DIAGNOSIS — R112 Nausea with vomiting, unspecified: Secondary | ICD-10-CM

## 2023-06-01 DIAGNOSIS — I10 Essential (primary) hypertension: Secondary | ICD-10-CM | POA: Diagnosis present

## 2023-06-01 DIAGNOSIS — N400 Enlarged prostate without lower urinary tract symptoms: Secondary | ICD-10-CM | POA: Diagnosis not present

## 2023-06-01 DIAGNOSIS — K449 Diaphragmatic hernia without obstruction or gangrene: Secondary | ICD-10-CM | POA: Diagnosis not present

## 2023-06-01 DIAGNOSIS — K56609 Unspecified intestinal obstruction, unspecified as to partial versus complete obstruction: Secondary | ICD-10-CM | POA: Diagnosis not present

## 2023-06-01 DIAGNOSIS — K6389 Other specified diseases of intestine: Secondary | ICD-10-CM | POA: Diagnosis not present

## 2023-06-01 DIAGNOSIS — R1032 Left lower quadrant pain: Secondary | ICD-10-CM | POA: Diagnosis not present

## 2023-06-01 DIAGNOSIS — S2249XA Multiple fractures of ribs, unspecified side, initial encounter for closed fracture: Secondary | ICD-10-CM

## 2023-06-01 DIAGNOSIS — F419 Anxiety disorder, unspecified: Secondary | ICD-10-CM | POA: Diagnosis not present

## 2023-06-01 LAB — COMPREHENSIVE METABOLIC PANEL
ALT: 17 U/L (ref 0–44)
AST: 23 U/L (ref 15–41)
Albumin: 3.8 g/dL (ref 3.5–5.0)
Alkaline Phosphatase: 65 U/L (ref 38–126)
Anion gap: 15 (ref 5–15)
BUN: 20 mg/dL (ref 8–23)
CO2: 19 mmol/L — ABNORMAL LOW (ref 22–32)
Calcium: 9.3 mg/dL (ref 8.9–10.3)
Chloride: 103 mmol/L (ref 98–111)
Creatinine, Ser: 1.02 mg/dL (ref 0.61–1.24)
GFR, Estimated: >60 mL/min (ref >60)
Glucose, Bld: 133 mg/dL — ABNORMAL HIGH (ref 70–99)
Potassium: 3.2 mmol/L — ABNORMAL LOW (ref 3.5–5.1)
Sodium: 137 mmol/L (ref 135–145)
Total Bilirubin: 1.1 mg/dL (ref 0.0–1.2)
Total Protein: 9 g/dL — ABNORMAL HIGH (ref 6.5–8.1)

## 2023-06-01 LAB — URINALYSIS, ROUTINE W REFLEX MICROSCOPIC
Bacteria, UA: NONE SEEN
Bilirubin Urine: NEGATIVE
Glucose, UA: NEGATIVE mg/dL
Hgb urine dipstick: NEGATIVE
Ketones, ur: 20 mg/dL — AB
Leukocytes,Ua: NEGATIVE
Nitrite: NEGATIVE
Protein, ur: 100 mg/dL — AB
Specific Gravity, Urine: 1.035 — ABNORMAL HIGH (ref 1.005–1.030)
pH: 5 (ref 5.0–8.0)

## 2023-06-01 LAB — CBC WITH DIFFERENTIAL/PLATELET
Abs Immature Granulocytes: 0.04 10*3/uL (ref 0.00–0.07)
Basophils Absolute: 0 10*3/uL (ref 0.0–0.1)
Basophils Relative: 0 %
Eosinophils Absolute: 0 10*3/uL (ref 0.0–0.5)
Eosinophils Relative: 0 %
HCT: 46.4 % (ref 39.0–52.0)
Hemoglobin: 14.9 g/dL (ref 13.0–17.0)
Immature Granulocytes: 0 %
Lymphocytes Relative: 7 %
Lymphs Abs: 0.8 10*3/uL (ref 0.7–4.0)
MCH: 29.3 pg (ref 26.0–34.0)
MCHC: 32.1 g/dL (ref 30.0–36.0)
MCV: 91.2 fL (ref 80.0–100.0)
Monocytes Absolute: 0.8 10*3/uL (ref 0.1–1.0)
Monocytes Relative: 7 %
Neutro Abs: 9.6 10*3/uL — ABNORMAL HIGH (ref 1.7–7.7)
Neutrophils Relative %: 86 %
Platelets: 382 10*3/uL (ref 150–400)
RBC: 5.09 MIL/uL (ref 4.22–5.81)
RDW: 13.2 % (ref 11.5–15.5)
WBC: 11.4 10*3/uL — ABNORMAL HIGH (ref 4.0–10.5)
nRBC: 0 % (ref 0.0–0.2)

## 2023-06-01 LAB — LIPASE, BLOOD: Lipase: 25 U/L (ref 11–51)

## 2023-06-01 MED ORDER — ACETAMINOPHEN 650 MG RE SUPP: 650.0000 mg | Freq: Four times a day (QID) | RECTAL | Status: DC | PRN

## 2023-06-01 MED ORDER — ONDANSETRON HCL 4 MG/2ML IJ SOLN
4.0000 mg | Freq: Once | INTRAMUSCULAR | Status: AC
  Administered 2023-06-01: 4 mg via INTRAVENOUS
  Filled 2023-06-01: qty 2

## 2023-06-01 MED ORDER — POTASSIUM CHLORIDE 10 MEQ/100ML IV SOLN
10.0000 meq | INTRAVENOUS | Status: AC
  Administered 2023-06-01 – 2023-06-02 (×4): 10 meq via INTRAVENOUS
  Filled 2023-06-01 (×3): qty 100

## 2023-06-01 MED ORDER — ACETAMINOPHEN 325 MG PO TABS: 650.0000 mg | ORAL_TABLET | Freq: Four times a day (QID) | ORAL | Status: DC | PRN

## 2023-06-01 MED ORDER — FENTANYL CITRATE PF 50 MCG/ML IJ SOSY
50.0000 ug | PREFILLED_SYRINGE | Freq: Once | INTRAMUSCULAR | Status: AC
  Administered 2023-06-01: 50 ug via INTRAVENOUS
  Filled 2023-06-01: qty 1

## 2023-06-01 MED ORDER — IOHEXOL 300 MG/ML  SOLN
100.0000 mL | Freq: Once | INTRAMUSCULAR | Status: AC | PRN
  Administered 2023-06-01: 100 mL via INTRAVENOUS

## 2023-06-01 MED ORDER — MORPHINE SULFATE (PF) 2 MG/ML IV SOLN
1.0000 mg | INTRAVENOUS | Status: DC | PRN
  Administered 2023-06-02 – 2023-06-03 (×3): 1 mg via INTRAVENOUS
  Filled 2023-06-01 (×3): qty 1

## 2023-06-01 MED ORDER — NALOXONE HCL 0.4 MG/ML IJ SOLN: 0.4000 mg | INTRAMUSCULAR | Status: DC | PRN

## 2023-06-01 MED ORDER — SODIUM CHLORIDE 0.9 % IV BOLUS
1000.0000 mL | Freq: Once | INTRAVENOUS | Status: AC
  Administered 2023-06-01: 1000 mL via INTRAVENOUS

## 2023-06-01 MED ORDER — SODIUM CHLORIDE 0.9 % IV SOLN: INTRAVENOUS | Status: AC

## 2023-06-01 MED ORDER — TRIMETHOBENZAMIDE HCL 100 MG/ML IM SOLN: 200.0000 mg | Freq: Three times a day (TID) | INTRAMUSCULAR | Status: DC | PRN

## 2023-06-01 NOTE — ED Provider Notes (Signed)
 [place] EMERGENCY DEPARTMENT AT [facility] Provider Note   CSN: [id number] Arrival date & time: 06/01/23  1302     History No chief complaint on file.   Wayne Lowe is a 73 y.o. male with medical history of anxiety, diverticulitis, hypertension, recurrent low back pain.  Patient presents to ED for evaluation abdominal pain.  States that he has progressively worsening abdominal distention and bloating, pressure for the last 1 week.  Reports that he has not had a bowel movement in 3 days.  States he is no longer passing gas either.  Denies history of obstruction.  Is endorsing history of diverticulitis.  Denies any blood in his stool.  Does endorsing nausea, vomiting and abdominal "pressure".  Denies fevers, chest pain, shortness of breath.  HPI     Home Medications Prior to Admission medications   Medication Sig Start Date End Date Taking? Authorizing Provider  ALPRAZolam (NIRAVAM) 0.25 MG dissolvable tablet Take 0.25 mg by mouth at bedtime as needed for anxiety.    [provider]  amLODipine (NORVASC) 2.5 MG tablet Take 1 tablet (2.5 mg total) by mouth in the morning and at bedtime. 01/21/23   Pricilla Riffle, MD  amoxicillin-clavulanate (AUGMENTIN) 875-125 MG per tablet Take 1 tablet by mouth every 12 (twelve) hours. 02/01/12   Schinlever, Santina Evans, PA-C  aspirin EC 81 MG tablet Take 81 mg by mouth daily. Swallow whole.    [provider]  buPROPion (ZYBAN) 150 MG 12 hr tablet Take 150 mg by mouth 2 (two) times daily.    [provider]  cetirizine (ZYRTEC) 10 MG chewable tablet Chew 10 mg by mouth daily.    [provider]  finasteride (PROSCAR) 5 MG tablet Take 5 mg by mouth daily.    [provider]  FLUoxetine (PROZAC) 40 MG capsule Take 80 mg by mouth every morning.    [provider]  fluticasone (FLONASE) 50 MCG/ACT nasal spray Place 1 spray into both nostrils daily.    [provider]  gemfibrozil  (LOPID) 600 MG tablet Take 1,200 mg by mouth every evening.    [provider]  levocetirizine (XYZAL) 5 MG tablet Take 5 mg by mouth every evening.    [provider]  omeprazole (PRILOSEC) 40 MG capsule Take 40 mg by mouth daily.    [provider]  sildenafil (REVATIO) 20 MG tablet Take 20 mg by mouth 3 (three) times daily.    [provider]  tadalafil (CIALIS) 20 MG tablet Take 20 mg by mouth daily as needed for erectile dysfunction.    [provider]  Tamsulosin HCl (FLOMAX) 0.4 MG CAPS Take 0.4 mg by mouth 2 (two) times daily.    [provider]  traZODone (DESYREL) 100 MG tablet Take 100 mg by mouth at bedtime.    [provider]  trimethoprim-polymyxin b (POLYTRIM) ophthalmic solution Place 1 drop into both eyes every 4 (four) hours.    [provider]      Allergies    Buspar [buspirone], Crestor [rosuvastatin], Erythromycin, Lipitor [atorvastatin], Lovastatin, Pravachol [pravastatin], and Wellbutrin [bupropion]    Review of Systems   Review of Systems  Constitutional:  Negative for fever.  Respiratory:  Negative for shortness of breath.   Cardiovascular:  Negative for chest pain.  Gastrointestinal:  Positive for abdominal distention, abdominal pain, constipation, nausea and vomiting.  All other systems reviewed and are negative.   Physical Exam Updated Vital Signs BP (!) 142/95 (BP  Location: Left Arm)   Pulse 71   Temp (!) 97.4 F (36.3 C) (Oral)   Resp 18   Ht 5\' 9"  (1.753 m)   Wt 88.5 kg   SpO2 100%   BMI 28.80 kg/m  Physical Exam Vitals and nursing note reviewed.  Constitutional:      General: He is not in acute distress.    Appearance: Normal appearance. He is not ill-appearing, toxic-appearing or diaphoretic.  HENT:     Head: Normocephalic and atraumatic.     Mouth/Throat:     Mouth: Mucous membranes are moist.     Pharynx: Oropharynx is clear.  Eyes:     Extraocular Movements:  Extraocular movements intact.     Conjunctiva/sclera: Conjunctivae normal.     Pupils: Pupils are equal, round, and reactive to light.  Cardiovascular:     Rate and Rhythm: Normal rate and regular rhythm.  Pulmonary:     Effort: Pulmonary effort is normal.     Breath sounds: Normal breath sounds. No wheezing.  Abdominal:     General: Abdomen is flat. Bowel sounds are normal. There is distension.     Palpations: Abdomen is soft.     Tenderness: There is abdominal tenderness.  Musculoskeletal:     Cervical back: Normal range of motion and neck supple. No tenderness.  Skin:    General: Skin is warm and dry.     Capillary Refill: Capillary refill takes less than 2 seconds.  Neurological:     Mental Status: He is alert and oriented to person, place, and time.     ED Results / Procedures / Treatments   Labs (all labs ordered are listed, but only abnormal results are displayed) Labs Reviewed  CBC WITH DIFFERENTIAL/PLATELET  COMPREHENSIVE METABOLIC PANEL  LIPASE, BLOOD  URINALYSIS, ROUTINE W REFLEX MICROSCOPIC    EKG None  Radiology No results found.  Procedures Procedures   Medications Ordered in ED Medications  ondansetron (ZOFRAN) injection 4 mg (has no administration in time range)  sodium chloride 0.9 % bolus 1,000 mL (has no administration in time range)  fentaNYL (SUBLIMAZE) injection 50 mcg (has no administration in time range)    ED Course/ Medical Decision Making/ A&P Clinical Course as of 06/01/23 1802  Sat Jun 01, 2023  1753 GI Hinda Lenis [CG]    Clinical Course User Index [CG] Al Decant, PA-C   Medical Decision Making Amount and/or Complexity of Data Reviewed Labs: ordered. Radiology: ordered.  Risk Prescription drug management.   73 year old male presents for evaluation.  Please see HPI for further details.  On examination the patient is afebrile and nontachycardic.  Lung sounds are clear bilaterally, nonhypoxic.  Abdomen has  tenderness throughout, distention noted.  Neurological examinations at baseline.  Suspect diverticulitis versus bowel obstruction due to abdominal distention, patient has history of diverticulitis.  Will collect a CBC, CMP, lipase, urinalysis and image patient's abdomen with CT scan.  Will provide patient with fentanyl for pain control, Zofran for nausea and 1 L of fluid.  CBC with leukocytosis 11.4, no anemia.  Lipase WNL.  CMP with potassium 3.2 however no other electrolyte derangement.  Urinalysis pending.  CT abdomen pelvis pending.  At this time, patient workup pending.  Signed out to provider [facility].   Final Clinical Impression(s) / ED Diagnoses Final diagnoses:  None    Rx / DC Orders ED Discharge Orders     None         Al Decant, PA-C 06/13/23  1947    Terald Sleeper, MD 06/14/23 [phone]

## 2023-06-01 NOTE — ED Notes (Signed)
 Patient is being discharged from the Urgent Care and sent to the Emergency Department via POV . Per Reita May, FNP, patient is in need of higher level of care due to possible obstruction. Patient is aware and verbalizes understanding of plan of care.  Vitals:   06/01/23 1205  BP: 116/78  Pulse: 88  Resp: (!) 23  Temp: (!) 97.5 F (36.4 C)  SpO2: 96%

## 2023-06-01 NOTE — H&P (Signed)
 History and Physical    Wayne Lowe:[id number] DOB: [birth year] DOA: 06/01/2023  PCP: [facility] @ [place]  Patient coming from: Home  Chief Complaint: Abdominal pain  HPI: Wayne Lowe is a 73 y.o. male with medical history significant of hypertension, hyperlipidemia, diverticulitis, anxiety, depression, BPH, GERD presents to the ED for evaluation of abdominal pain/distention, nausea, vomiting, and constipation.  Vital signs on arrival: Temperature 97.5 F, pulse 71, respiratory rate 16, blood pressure 149/85, and SpO2 100% on room air.  Labs showing WBC count 11.4, potassium 3.2, bicarb 19, anion gap 15, glucose 133, creatinine 1.0, normal lipase and LFTs, UA not suggestive of infection.  CT abdomen pelvis with contrast showing: "IMPRESSION: 1. Diffuse colonic dilatation with obstruction at the level of the sigmoid colon. There is mild adjacent fat stranding which could be secondary to ongoing diverticulitis, however a malignant obstruction at this site needs to be excluded by endoscopy. 2. Thickening of the bladder dome, greater to the left with adjacent fat stranding. There is a soft tissue tract extending from the sigmoid colon to the bladder, best seen on the coronal series. This is suspicious for early fistula development and cystitis. 3. Interval development of acute to subacute fractures at the posterolateral segments of the right ninth and tenth ribs. 4. 1.5 cm hyperenhancing lesion in the right hepatic lobe is not significantly changed since prior examination. The stability favors a benign etiology such as a hemangioma, however definitive diagnosis would require multiphasic CT or MRI. 5. Aortic atherosclerosis."  Patient was given fentanyl, Zofran, and 1 L normal saline.  ED PA discussed the case with gastroenterologist Dr. Bosie Clos who recommended holding off NG tube since patient was not actively vomiting.  Also recommended holding off starting  antibiotics at this time.  [facility] will consult in the morning.  [facility] called to admit.  Patient is reporting 1 week history of progressively worsening abdominal pain and distention, nausea, and vomiting.  His last regular bowel movement was a week ago, did have a very small bowel movement a few days ago.  He vomited once in the ED but no further episodes.  Continues to complain of 7/10 intensity generalized abdominal pain/discomfort and requesting pain medications.  No other complaints.  Denies shortness of breath or chest pain.  Review of Systems:  Review of Systems  All other systems reviewed and are negative.   Past Medical History:  Diagnosis Date   Anxiety    COVID-19    Diverticulitis    Dizziness    ED (erectile dysfunction) of non-organic origin    Enlarged prostate without lower urinary tract symptoms (luts)    Fluctuating blood pressure    History of BPH    HTN (hypertension)    Hypercholesteremia    Impaired fasting glucose    Memory changes    Moderate major depression (HCC)    Recurrent low back pain    Restless leg    Sleep disorder    Tremor    TSH deficiency     No past surgical history on file.   reports that he has quit smoking. His smoking use included cigarettes. He does not have any smokeless tobacco history on file. He reports that he does not drink alcohol. No history on file for drug use.  Allergies  Allergen Reactions   Buspar [Buspirone]     EXACERBATED IBS    Crestor [Rosuvastatin]     BODY ACHES   Erythromycin  GI DISTRESS   Lipitor [Atorvastatin]     JOINT PAIN    Lovastatin     MUSCLE ACHES    Pravachol [Pravastatin]     MM WEAKNESS   Wellbutrin [Bupropion]     No family history on file.  Prior to Admission medications   Medication Sig Start Date End Date Taking? Authorizing Provider  ALPRAZolam (NIRAVAM) 0.25 MG dissolvable tablet Take 0.25 mg by mouth at bedtime as needed for anxiety.    [provider]   amLODipine (NORVASC) 2.5 MG tablet Take 1 tablet (2.5 mg total) by mouth in the morning and at bedtime. 01/21/23   Pricilla Riffle, MD  amoxicillin-clavulanate (AUGMENTIN) 875-125 MG per tablet Take 1 tablet by mouth every 12 (twelve) hours. 02/01/12   Schinlever, Santina Evans, PA-C  aspirin EC 81 MG tablet Take 81 mg by mouth daily. Swallow whole.    [provider]  buPROPion (ZYBAN) 150 MG 12 hr tablet Take 150 mg by mouth 2 (two) times daily.    [provider]  cetirizine (ZYRTEC) 10 MG chewable tablet Chew 10 mg by mouth daily.    [provider]  finasteride (PROSCAR) 5 MG tablet Take 5 mg by mouth daily.    [provider]  FLUoxetine (PROZAC) 40 MG capsule Take 80 mg by mouth every morning.    [provider]  fluticasone (FLONASE) 50 MCG/ACT nasal spray Place 1 spray into both nostrils daily.    [provider]  gemfibrozil (LOPID) 600 MG tablet Take 1,200 mg by mouth every evening.    [provider]  levocetirizine (XYZAL) 5 MG tablet Take 5 mg by mouth every evening.    [provider]  omeprazole (PRILOSEC) 40 MG capsule Take 40 mg by mouth daily.    [provider]  sildenafil (REVATIO) 20 MG tablet Take 20 mg by mouth 3 (three) times daily.    [provider]  tadalafil (CIALIS) 20 MG tablet Take 20 mg by mouth daily as needed for erectile dysfunction.    [provider]  Tamsulosin HCl (FLOMAX) 0.4 MG CAPS Take 0.4 mg by mouth 2 (two) times daily.    [provider]  traZODone (DESYREL) 100 MG tablet Take 100 mg by mouth at bedtime.    [provider]  trimethoprim-polymyxin b (POLYTRIM) ophthalmic solution Place 1 drop into both eyes every 4 (four) hours.    [provider]    Physical Exam: Vitals:   06/01/23 1333 06/01/23 1737 06/01/23 1830 06/01/23 2000  BP:  (!) 142/95 (!) 171/73 (!) 156/78  Pulse:  71 65 62  Resp:  18 18 14   Temp:  (!) 97.4 F (36.3  C)  97.9 F (36.6 C)  TempSrc:  Oral    SpO2:  100% 100% 98%  Weight: 88.5 kg     Height: 5\' 9"  (1.753 m)       Physical Exam Vitals reviewed.  Constitutional:      General: He is not in acute distress. HENT:     Head: Normocephalic and atraumatic.  Eyes:     Extraocular Movements: Extraocular movements intact.  Cardiovascular:     Rate and Rhythm: Normal rate and regular rhythm.     Pulses: Normal pulses.  Pulmonary:     Effort: Pulmonary effort is normal. No respiratory distress.     Breath sounds: Normal breath sounds. No wheezing or rales.  Abdominal:     General: There is distension.  Palpations: Abdomen is soft.     Tenderness: There is abdominal tenderness. There is no guarding.     Comments: Hypoactive bowel sounds Generalized tenderness to palpation  Musculoskeletal:     Cervical back: Normal range of motion.     Right lower leg: No edema.     Left lower leg: No edema.  Skin:    General: Skin is warm and dry.  Neurological:     General: No focal deficit present.     Mental Status: He is alert and oriented to person, place, and time.     Labs on Admission: I have personally reviewed following labs and imaging studies  CBC: Recent Labs  Lab 06/01/23 1800  WBC 11.4*  NEUTROABS 9.6*  HGB 14.9  HCT 46.4  MCV 91.2  PLT 382   Basic Metabolic Panel: Recent Labs  Lab 06/01/23 1800  NA 137  K 3.2*  CL 103  CO2 19*  GLUCOSE 133*  BUN 20  CREATININE 1.02  CALCIUM 9.3   GFR: Estimated Creatinine Clearance: 69.9 mL/min (by C-G formula based on SCr of 1.02 mg/dL). Liver Function Tests: Recent Labs  Lab 06/01/23 1800  AST 23  ALT 17  ALKPHOS 65  BILITOT 1.1  PROT 9.0*  ALBUMIN 3.8   Recent Labs  Lab 06/01/23 1800  LIPASE 25   No results for input(s): "AMMONIA" in the last 168 hours. Coagulation Profile: No results for input(s): "INR", "PROTIME" in the last 168 hours. Cardiac Enzymes: No results for input(s): "CKTOTAL", "CKMB",  "CKMBINDEX", "TROPONINI" in the last 168 hours. BNP (last 3 results) No results for input(s): "PROBNP" in the last 8760 hours. HbA1C: No results for input(s): "HGBA1C" in the last 72 hours. CBG: No results for input(s): "GLUCAP" in the last 168 hours. Lipid Profile: No results for input(s): "CHOL", "HDL", "LDLCALC", "TRIG", "CHOLHDL", "LDLDIRECT" in the last 72 hours. Thyroid Function Tests: No results for input(s): "TSH", "T4TOTAL", "FREET4", "T3FREE", "THYROIDAB" in the last 72 hours. Anemia Panel: No results for input(s): "VITAMINB12", "FOLATE", "FERRITIN", "TIBC", "IRON", "RETICCTPCT" in the last 72 hours. Urine analysis:    Component Value Date/Time   COLORURINE AMBER (A) 06/01/2023 2026   APPEARANCEUR HAZY (A) 06/01/2023 2026   LABSPEC 1.035 (H) 06/01/2023 2026   PHURINE 5.0 06/01/2023 2026   GLUCOSEU NEGATIVE 06/01/2023 2026   HGBUR NEGATIVE 06/01/2023 2026   BILIRUBINUR NEGATIVE 06/01/2023 2026   KETONESUR 20 (A) 06/01/2023 2026   PROTEINUR 100 (A) 06/01/2023 2026   NITRITE NEGATIVE 06/01/2023 2026   LEUKOCYTESUR NEGATIVE 06/01/2023 2026    Radiological Exams on Admission: CT ABDOMEN PELVIS W CONTRAST Result Date: 06/01/2023 CLINICAL DATA:  Diverticulitis.  Bowel obstruction suspected. EXAM: CT ABDOMEN AND PELVIS WITH CONTRAST TECHNIQUE: Multidetector CT imaging of the abdomen and pelvis was performed using the standard protocol following bolus administration of intravenous contrast. RADIATION DOSE REDUCTION: This exam was performed according to the departmental dose-optimization program which includes automated exposure control, adjustment of the mA and/or kV according to patient size and/or use of iterative reconstruction technique. CONTRAST:  OMNIPAQUE IOHEXOL 300 MG/ML  SOLN COMPARISON:  10/24/2022 FINDINGS: Lower chest: No acute abnormality. Hepatobiliary: 1.5 cm hyperenhancing lesion seen in the right hepatic lobe (image 19, series 4). It is not significantly changed  since prior examination. The stability favors a benign etiology such as a hemangioma, however definitive diagnosis would require multiphasic CT or MRI. The liver, gallbladder, and bile ducts are otherwise unremarkable. Pancreas: Unremarkable. No pancreatic ductal dilatation or surrounding inflammatory  changes. Spleen: Normal in size without focal abnormality. Adrenals/Urinary Tract: Adrenal glands are normal. Numerous bilateral simple cysts do not require dedicated imaging surveillance. The largest is located in the lower pole the right kidney measuring 7.1 cm. There are also numerous subcentimeter renal hypodensities which are too small to fully characterize. These also likely represent simple cysts and do not require dedicated imaging follow-up. No hydronephrosis or hydroureter. There is thickening of the bladder dome, greater to the left with adjacent fat stranding. There is a soft tissue tract extending from the sigmoid colon to the bladder, best seen on the coronal series images 81-95. This is suspicious for early fistula development. Stomach/Bowel: Small hiatal hernia. Stomach and doudenum otherwise unremarkable. Appendix is normal. Diffuse colonic dilatation with obstruction at the level of the sigmoid colon, best seen on image 76 of series 4. There is mild adjacent fat stranding which could be secondary to ongoing diverticulitis, however a malignant obstruction at this site needs to be excluded by endoscopy. Vascular/Lymphatic: Calcified atheromatous plaque seen throughout the abdominal aorta and visualized branches. No enlarged abdominal or pelvic lymph nodes. Reproductive: Prostate is mildly enlarged nodular impressing upon the bladder neck. Testes appear incompletely descended, located within the inguinal canals bilaterally. Other: No abdominal wall hernia or abnormality. No abdominopelvic ascites. Musculoskeletal: Interval development of acute to subacute fractures at the posterolateral segments of the  right ninth and tenth ribs. IMPRESSION: 1. Diffuse colonic dilatation with obstruction at the level of the sigmoid colon. There is mild adjacent fat stranding which could be secondary to ongoing diverticulitis, however a malignant obstruction at this site needs to be excluded by endoscopy. 2. Thickening of the bladder dome, greater to the left with adjacent fat stranding. There is a soft tissue tract extending from the sigmoid colon to the bladder, best seen on the coronal series. This is suspicious for early fistula development and cystitis. 3. Interval development of acute to subacute fractures at the posterolateral segments of the right ninth and tenth ribs. 4. 1.5 cm hyperenhancing lesion in the right hepatic lobe is not significantly changed since prior examination. The stability favors a benign etiology such as a hemangioma, however definitive diagnosis would require multiphasic CT or MRI. 5. Aortic atherosclerosis. Aortic Atherosclerosis (ICD10-I70.0). Electronically Signed   By: Acquanetta Belling M.D.   On: 06/01/2023 20:25    EKG: Independently reviewed.  Sinus rhythm, QTc 521.  Assessment and Plan  Sigmoid colon obstruction Diverticulitis versus possible malignant obstruction CT showing diffuse colonic dilation with obstruction at the level of the sigmoid colon and there is mild adjacent fat stranding which could be secondary to ongoing diverticulitis, however, a malignant obstruction needs to be excluded by endoscopy.  Borderline leukocytosis on labs, no fever or signs of sepsis.  [facility] will consult in the morning -recommended holding off antibiotics and also holding off NG tube at this time since patient is not actively vomiting.  Keep n.p.o., IV fluid hydration, pain management.  IM Tigan PRN nausea/vomiting, avoid QT prolonging antiemetics.  Trend WBC count.  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  However, UA is not suggestive of infection.  Will hold off antibiotics at this time.  Acute to subacute posterolateral right 9th and 10th rib fractures No recent falls reported and patient is not endorsing any pain in this area.  Right hepatic lobe lesion CT showing a stable 1.5 cm hyperenhancing lesion in  the right hepatic lobe favored to be a hemangioma per radiologist.  However, definitive diagnosis would require multiphasic CT or MRI.  Will defer decision to GI team.  Mild hypokalemia QT prolongation QTc 521 on EKG.  Monitor potassium and magnesium levels, continue to replace as needed.  Avoid QT prolonging drugs and follow-up repeat EKG in the morning.  Mild metabolic acidosis Continue IV fluid hydration and monitor labs.  Replace bicarb if needed.  Hypertension  Hyperlipidemia  Anxiety and depression  BPH  GERD  DVT prophylaxis: {Blank single:19197::"Lovenox","SQ Heparin","IV heparin gtt","Xarelto","Eliquis","Coumadin","SCDs","***"} Code Status: {Blank single:19197::"Full Code","DNR","DNR/DNI","Comfort Care","***"} Family Communication: ***  Consults called: ***  Level of care: {Blank single:19197::"Med-Surg","Telemetry bed","Progressive Care Unit","Step Down Unit"} Admission status: *** Time Spent: 75+ minutes***  John Giovanni MD [facility]  If 7PM-7AM, please contact night-coverage [email or web address]  06/01/2023, 10:24 PM

## 2023-06-01 NOTE — ED Provider Notes (Addendum)
 Patient presents to urgent care with his wife who contributes to the history for evaluation of nausea, vomiting, and abdominal pain/distention that started approximately 8 days ago.  He has had minimal stool output in the last 8 days and states his abdomen feels tight and distended.  He had a few episodes of nausea with bilious emesis over the last 24 hours and is currently nauseated.  Denies blood/mucus to the emesis or stool.  History of diverticulitis without abscess or perforation. He currently denies shortness of breath, chest pain, fever, chills, and recent diarrhea.  He has not vomited stool contents.  Reports minimal oral intake over the last several days since becoming ill.  He has tried taking Dulcolax and stool softeners without relief.  Vital signs are hemodynamically stable, though he is ill-appearing. Bowel sounds present all 4 quadrants of the abdomen.  Tender to palpation to the generalized abdomen. Abdomen is distended and protuberant.  Recommend further workup in the emergency department to rule out bowel obstruction. He is stable for transport to the nearest emergency room via personal vehicle with his wife.  Discussed risks of deferring ED visit to which they expressed understanding and agreement with plan. Discharged from urgent care in stable condition.    Carlisle Beers, [place] 06/01/23 1236

## 2023-06-01 NOTE — ED Provider Notes (Signed)
 Patient is a handoff from [place], [place].  In short, patient is here with concerns of possible bowel obstruction.  He endorses some abdominal distention, inability to pass gas, and some nausea.  Had 1 episode of vomiting yesterday but no frequent episodes since then.  No prior history of bowel obstruction.  Current plan is CT abdomen pelvis pending for disposition. Physical Exam  BP (!) 156/78   Pulse 62   Temp 97.9 F (36.6 C)   Resp 14   Ht 5\' 9"  (1.753 m)   Wt 88.5 kg   SpO2 98%   BMI 28.80 kg/m   Physical Exam Vitals and nursing note reviewed.  Constitutional:      General: He is not in acute distress.    Appearance: He is well-developed.  HENT:     Head: Normocephalic and atraumatic.  Eyes:     Conjunctiva/sclera: Conjunctivae normal.  Cardiovascular:     Rate and Rhythm: Normal rate and regular rhythm.     Heart sounds: No murmur heard. Pulmonary:     Effort: Pulmonary effort is normal. No respiratory distress.     Breath sounds: Normal breath sounds.  Abdominal:     General: There is distension.     Palpations: Abdomen is soft.     Tenderness: There is abdominal tenderness in the left lower quadrant.  Musculoskeletal:        General: No swelling.     Cervical back: Neck supple.  Skin:    General: Skin is warm and dry.     Capillary Refill: Capillary refill takes less than 2 seconds.  Neurological:     Mental Status: He is alert.  Psychiatric:        Mood and Affect: Mood normal.     Procedures  Procedures  ED Course / MDM   Clinical Course as of 06/01/23 2057  Sat Jun 01, 2023  1753 GI Hinda Lenis [CG]    Clinical Course User Index [CG] Al Decant, PA-C   Medical Decision Making Amount and/or Complexity of Data Reviewed Labs: ordered. Radiology: ordered.  Risk Prescription drug management. Decision regarding hospitalization.   Patient is a handoff from [place], [place].  In short, patient is here with concerns of possible bowel  obstruction.  He endorses some abdominal distention, inability to pass gas, and some nausea.  Had 1 episode of vomiting yesterday but no frequent episodes since then.  No prior history of bowel obstruction.  Current plan is CT abdomen pelvis pending for disposition.  CTAP shows diffuse colonic dilatation with obstruction at the level of the sigmoid colon. There is mild adjacent fat stranding which could be secondary to ongoing diverticulitis, however a malignant obstruction at this site needs to be excluded by endoscopy. 2. Thickening of the bladder dome, greater to the left with adjacent fat stranding. There is a soft tissue tract extending from the sigmoid colon to the bladder, best seen on the coronal series. This is suspicious for early fistula development and cystitis.  Spoke with Dr. Maisie Fus with general surgery who advised outpatient follow up as this is not small bowel. Can be admitted if symptoms warrant admission.  Spoke with Dr. Bosie Clos, GI, who advised that GI will plan on evaluation tomorrow in consult. Admit to medicine.  Spoke with Dr. Loney Loh, hospitalist, who will be admitting patient.       Smitty Knudsen, PA-C 06/01/23 2956    Derwood Kaplan, MD 06/02/23 [phone]

## 2023-06-01 NOTE — ED Notes (Signed)
 Hospitalist at the bedside

## 2023-06-01 NOTE — ED Triage Notes (Signed)
 Pt c/o abdominal pain, vomiting, nausea, and not being able to have BM for 1week. Has not had normal BM of 8 days. Had very small bm 4days ago.    He has taken x lax, Doculax for several days

## 2023-06-01 NOTE — ED Triage Notes (Signed)
 Patient c/o GI bloating pushing against diaphragm Causing shortness of breath No full bowel movement in 5-6 days , normally has 1-2 days Now nauseated and low fluid intake  H/o diverticulitis Pain reported in abdomen 7-8/10     O2 sat 100 in triage

## 2023-06-01 NOTE — ED Notes (Signed)
 Patient transported to CT

## 2023-06-01 NOTE — ED Notes (Signed)
 I made nurse Prudencio Burly that patient "stated they were getting worst.  Room came open so I took him back however I checked vitals first.

## 2023-06-02 ENCOUNTER — Other Ambulatory Visit: Payer: Self-pay

## 2023-06-02 DIAGNOSIS — R9431 Abnormal electrocardiogram [ECG] [EKG]: Secondary | ICD-10-CM

## 2023-06-02 DIAGNOSIS — K56609 Unspecified intestinal obstruction, unspecified as to partial versus complete obstruction: Secondary | ICD-10-CM | POA: Diagnosis not present

## 2023-06-02 DIAGNOSIS — N321 Vesicointestinal fistula: Secondary | ICD-10-CM

## 2023-06-02 DIAGNOSIS — S2249XA Multiple fractures of ribs, unspecified side, initial encounter for closed fracture: Secondary | ICD-10-CM

## 2023-06-02 DIAGNOSIS — E876 Hypokalemia: Secondary | ICD-10-CM

## 2023-06-02 DIAGNOSIS — I10 Essential (primary) hypertension: Secondary | ICD-10-CM

## 2023-06-02 LAB — CBC
HCT: 44 % (ref 39.0–52.0)
Hemoglobin: 13.9 g/dL (ref 13.0–17.0)
MCH: 29.4 pg (ref 26.0–34.0)
MCHC: 31.6 g/dL (ref 30.0–36.0)
MCV: 93.2 fL (ref 80.0–100.0)
Platelets: 342 10*3/uL (ref 150–400)
RBC: 4.72 MIL/uL (ref 4.22–5.81)
RDW: 13.4 % (ref 11.5–15.5)
WBC: 10.9 10*3/uL — ABNORMAL HIGH (ref 4.0–10.5)
nRBC: 0 % (ref 0.0–0.2)

## 2023-06-02 LAB — BASIC METABOLIC PANEL
Anion gap: 9 (ref 5–15)
BUN: 16 mg/dL (ref 8–23)
CO2: 21 mmol/L — ABNORMAL LOW (ref 22–32)
Calcium: 8.8 mg/dL — ABNORMAL LOW (ref 8.9–10.3)
Chloride: 110 mmol/L (ref 98–111)
Creatinine, Ser: 0.77 mg/dL (ref 0.61–1.24)
GFR, Estimated: >60 mL/min (ref >60)
Glucose, Bld: 113 mg/dL — ABNORMAL HIGH (ref 70–99)
Potassium: 3.9 mmol/L (ref 3.5–5.1)
Sodium: 140 mmol/L (ref 135–145)

## 2023-06-02 LAB — MAGNESIUM: Magnesium: 2.4 mg/dL (ref 1.7–2.4)

## 2023-06-02 MED ORDER — PROCHLORPERAZINE EDISYLATE 10 MG/2ML IJ SOLN
10.0000 mg | Freq: Four times a day (QID) | INTRAMUSCULAR | Status: DC | PRN
  Administered 2023-06-02 – 2023-06-03 (×3): 10 mg via INTRAVENOUS
  Filled 2023-06-02 (×3): qty 2

## 2023-06-02 MED ORDER — SODIUM CHLORIDE 0.9 % IV SOLN: INTRAVENOUS | Status: DC

## 2023-06-02 MED ORDER — HYDRALAZINE HCL 20 MG/ML IJ SOLN
10.0000 mg | Freq: Four times a day (QID) | INTRAMUSCULAR | Status: DC | PRN
  Administered 2023-06-05 (×2): 10 mg via INTRAVENOUS
  Filled 2023-06-02 (×2): qty 1

## 2023-06-02 MED ORDER — HYDRALAZINE HCL 20 MG/ML IJ SOLN
5.0000 mg | Freq: Four times a day (QID) | INTRAMUSCULAR | Status: DC | PRN
  Administered 2023-06-02: 5 mg via INTRAVENOUS
  Filled 2023-06-02: qty 1

## 2023-06-02 NOTE — Plan of Care (Signed)

## 2023-06-02 NOTE — H&P (View-Only) (Signed)
 Referring Provider: Dr. Jacqulyn Bath Primary Care Physician:  [facility] [place] Primary Gastroenterologist:  Dr. Dulce Sellar  Reason for Consultation:  Colonic obstruction  HPI: Wayne Lowe is a 73 y.o. male with the acute change in bowel habits starting one week ago when he developed diffuse abdominal pain and distention and inability to move his bowels (except for a very small BM several days ago) when he normally goes daily. Vomited in ER one time. Denies rectal bleeding or melena. Denies any new meds prior to change in BMs. Last colonoscopy in 01/2016 showed left-sided diverticulosis. Diverticulitis in July 2024.   CT showing regarding colon:  1. Diffuse colonic dilatation with obstruction at the level of the sigmoid colon. There is mild adjacent fat stranding which could be secondary to ongoing diverticulitis, however a malignant obstruction at this site needs to be excluded by endoscopy.  Past Medical History:  Diagnosis Date   Anxiety    COVID-19    Diverticulitis    Dizziness    ED (erectile dysfunction) of non-organic origin    Enlarged prostate without lower urinary tract symptoms (luts)    Fluctuating blood pressure    History of BPH    HTN (hypertension)    Hypercholesteremia    Impaired fasting glucose    Memory changes    Moderate major depression (HCC)    Recurrent low back pain    Restless leg    Sleep disorder    Tremor    TSH deficiency     No past surgical history on file.  Prior to Admission medications   Medication Sig Start Date End Date Taking? Authorizing Provider  amLODipine (NORVASC) 5 MG tablet Take 5 mg by mouth daily.   Yes [provider]  finasteride (PROSCAR) 5 MG tablet Take 5 mg by mouth daily.   Yes [provider]  FLUoxetine (PROZAC) 40 MG capsule Take 80 mg by mouth every morning.   Yes [provider]  traZODone (DESYREL) 100 MG tablet Take 100 mg by mouth at bedtime.   Yes [provider]  amoxicillin-clavulanate (AUGMENTIN) 875-125 MG per tablet Take 1 tablet by mouth every 12 (twelve) hours. Patient not taking: Reported on 06/01/2023 02/01/12   Schinlever, Santina Evans, PA-C    Scheduled Meds: Continuous Infusions: PRN Meds:.acetaminophen **OR** acetaminophen, hydrALAZINE, morphine injection, naLOXone (NARCAN)  injection, prochlorperazine, trimethobenzamide  Allergies as of 06/01/2023 - Review Complete 06/01/2023  Allergen Reaction Noted   Buspar [buspirone]  01/21/2023   Crestor [rosuvastatin]  01/21/2023   Erythromycin  01/21/2023   Lipitor [atorvastatin]  01/21/2023   Lovastatin  01/21/2023   Pravachol [pravastatin]  01/21/2023   Wellbutrin [bupropion]  01/21/2023    No family history on file.  Social History   Socioeconomic History   Marital status: Married    Spouse name: Not on file   Number of children: Not on file   Years of education: Not on file   Highest education level: Not on file  Occupational History   Not on file  Tobacco Use   Smoking status: Former    Types: Cigarettes   Smokeless tobacco: Not on file  Substance and Sexual Activity   Alcohol use: No   Drug use: Not on file   Sexual activity: Not on file  Other Topics Concern   Not on file  Social History Narrative   Not on file   Social Drivers of Health   Financial Resource Strain: Not on file  Food Insecurity: No  Food Insecurity (06/02/2023)   Hunger Vital Sign    Worried About Running Out of Food in the Last Year: Never true    Ran Out of Food in the Last Year: Never true  Transportation Needs: No Transportation Needs (06/02/2023)   PRAPARE - Administrator, Civil Service (Medical): No    Lack of Transportation (Non-Medical): No  Physical Activity: Not on file  Stress: Not on file  Social Connections: Moderately Integrated (06/02/2023)   Social Connection and Isolation Panel [NHANES]    Frequency of Communication with Friends and Family: More than three  times a week    Frequency of Social Gatherings with Friends and Family: Twice a week    Attends Religious Services: More than 4 times per year    Active Member of [employer] or Organizations: No    Attends Banker Meetings: Never    Marital Status: Married  Catering manager Violence: Not At Risk (06/02/2023)   Humiliation, Afraid, Rape, and Kick questionnaire    Fear of Current or Ex-Partner: No    Emotionally Abused: No    Physically Abused: No    Sexually Abused: No    Review of Systems: All negative except as stated above in HPI.  Physical Exam: Vital signs: Vitals:   06/02/23 0459 06/02/23 0859  BP: (!) 166/81 134/76  Pulse: 75 73  Resp: 18 20  Temp: 97.7 F (36.5 C) 98 F (36.7 C)  SpO2: 99% 98%   Last BM Date : 05/28/23 General:   Lethargic, elderly, well-nourished, no acute distress  Head: normocephalic, atraumatic Eyes: anicteric sclera ENT: oropharynx clear Neck: supple, nontender Lungs:  Clear throughout to auscultation.   No wheezes, crackles, or rhonchi. No acute distress. Heart:  Regular rate and rhythm; no murmurs, clicks, rubs,  or gallops. Abdomen: distended, diffuse tenderness with guarding, soft, nondistended, +BS  Rectal:  Deferred Ext: no edema  GI:  Lab Results: Recent Labs    06/01/23 1800 06/02/23 0623  WBC 11.4* 10.9*  HGB 14.9 13.9  HCT 46.4 44.0  PLT 382 342   BMET Recent Labs    06/01/23 1800 06/02/23 0623  NA 137 140  K 3.2* 3.9  CL 103 110  CO2 19* 21*  GLUCOSE 133* 113*  BUN 20 16  CREATININE 1.02 0.77  CALCIUM 9.3 8.8*   LFT Recent Labs    06/01/23 1800  PROT 9.0*  ALBUMIN 3.8  AST 23  ALT 17  ALKPHOS 65  BILITOT 1.1   PT/INR No results for input(s): "LABPROT", "INR" in the last 72 hours.     Impression/Plan: Colonic obstruction in sigmoid colon - question malignancy vs diverticulitis but fecal impaction could have this appearance as well. Enemas today. Flexible sigmoidoscopy tomorrow by Dr.  Lorenso Quarry. NPO. Supportive care.     LOS: 1 day   Shirley Friar  06/02/2023, 1:20 PM  Questions please call [phone]

## 2023-06-02 NOTE — Plan of Care (Signed)
  Problem: Education: Goal: Knowledge of General Education information will improve Description: Including pain rating scale, medication(s)/side effects and non-pharmacologic comfort measures Outcome: Progressing   Problem: Clinical Measurements: Goal: Ability to maintain clinical measurements within normal limits will improve Outcome: Progressing Goal: Diagnostic test results will improve Outcome: Progressing   Problem: Nutrition: Goal: Adequate nutrition will be maintained Outcome: Not Progressing   Problem: Coping: Goal: Level of anxiety will decrease Outcome: Progressing   Problem: Elimination: Goal: Will not experience complications related to bowel motility Outcome: Not Progressing   Problem: Pain Managment: Goal: General experience of comfort will improve and/or be controlled Outcome: Progressing

## 2023-06-02 NOTE — ED Notes (Signed)
 ED TO INPATIENT HANDOFF REPORT  ED Nurse Name and Phone #: Leanord Hawking, RN  S Name/Age/Gender Wayne Lowe 73 y.o. male Room/Bed: WA24/WA24  Code Status   Code Status: Full Code  Home/SNF/Other Home Patient oriented to: self, place, time, and situation Is this baseline? Yes   Triage Complete: Triage complete  Chief Complaint Bowel obstruction (HCC) [K56.609]  Triage Note Patient c/o GI bloating pushing against diaphragm Causing shortness of breath No full bowel movement in 5-6 days , normally has 1-2 days Now nauseated and low fluid intake  H/o diverticulitis Pain reported in abdomen 7-8/10     O2 sat 100 in triage    Allergies Allergies  Allergen Reactions   Buspar [Buspirone]     EXACERBATED IBS    Crestor [Rosuvastatin]     BODY ACHES   Erythromycin     GI DISTRESS   Lipitor [Atorvastatin]     JOINT PAIN    Lovastatin     MUSCLE ACHES    Pravachol [Pravastatin]     MM WEAKNESS   Wellbutrin [Bupropion]     Level of Care/Admitting Diagnosis ED Disposition     ED Disposition  Admit   Condition  --   Comment  Hospital Area: [facility] [[id number]]  Level of Care: Telemetry [5]  Admit to tele based on following criteria: Monitor QTC interval  May admit patient to [facility] or [facility] if equivalent level of care is available:: Yes  Covid Evaluation: Asymptomatic - no recent exposure (last 10 days) testing not required  Diagnosis: Bowel obstruction [facility]) [[id number]]  Admitting Physician: John Giovanni [[id number]]  Attending Physician: John Giovanni [[id number]]  Certification:: I certify this patient will need inpatient services for at least 2 midnights  Expected Medical Readiness: 06/03/2023          B Medical/Surgery History Past Medical History:  Diagnosis Date   Anxiety    COVID-19    Diverticulitis    Dizziness    ED (erectile dysfunction) of non-organic origin    Enlarged prostate without lower urinary tract  symptoms (luts)    Fluctuating blood pressure    History of BPH    HTN (hypertension)    Hypercholesteremia    Impaired fasting glucose    Memory changes    Moderate major depression (HCC)    Recurrent low back pain    Restless leg    Sleep disorder    Tremor    TSH deficiency    No past surgical history on file.   A IV Location/Drains/Wounds Patient Lines/Drains/Airways Status     Active Line/Drains/Airways     Name Placement date Placement time Site Days   Peripheral IV 06/01/23 22 G Anterior;Right Forearm 06/01/23  1834  Forearm  1            Intake/Output Last 24 hours  Intake/Output Summary (Last 24 hours) at 06/02/2023 0011 Last data filed at 06/01/2023 2038 Gross per 24 hour  Intake --  Output 500 ml  Net -500 ml    Labs/Imaging Results for orders placed or performed during the hospital encounter of 06/01/23 (from the past 48 hours)  CBC with Differential     Status: Abnormal   Collection Time: 06/01/23  6:00 PM  Result Value Ref Range   WBC 11.4 (H) 4.0 - 10.5 K/uL   RBC 5.09 4.22 - 5.81 MIL/uL   Hemoglobin 14.9 13.0 - 17.0 g/dL   HCT 42.5 95.6 - 38.7 %   MCV 91.2  80.0 - 100.0 fL   MCH 29.3 26.0 - 34.0 pg   MCHC 32.1 30.0 - 36.0 g/dL   RDW 29.5 62.1 - 30.8 %   Platelets 382 150 - 400 K/uL   nRBC 0.0 0.0 - 0.2 %   Neutrophils Relative % 86 %   Neutro Abs 9.6 (H) 1.7 - 7.7 K/uL   Lymphocytes Relative 7 %   Lymphs Abs 0.8 0.7 - 4.0 K/uL   Monocytes Relative 7 %   Monocytes Absolute 0.8 0.1 - 1.0 K/uL   Eosinophils Relative 0 %   Eosinophils Absolute 0.0 0.0 - 0.5 K/uL   Basophils Relative 0 %   Basophils Absolute 0.0 0.0 - 0.1 K/uL   Immature Granulocytes 0 %   Abs Immature Granulocytes 0.04 0.00 - 0.07 K/uL    Comment: Performed at [facility], [address] [place], [place] [address]  Comprehensive metabolic panel     Status: Abnormal   Collection Time: 06/01/23  6:00 PM  Result Value Ref Range   Sodium 137 135 - 145  mmol/L   Potassium 3.2 (L) 3.5 - 5.1 mmol/L   Chloride 103 98 - 111 mmol/L   CO2 19 (L) 22 - 32 mmol/L   Glucose, Bld 133 (H) 70 - 99 mg/dL    Comment: Glucose reference range applies only to samples taken after fasting for at least 8 hours.   BUN 20 8 - 23 mg/dL   Creatinine, Ser 6.96 0.61 - 1.24 mg/dL   Calcium 9.3 8.9 - 29.5 mg/dL   Total Protein 9.0 (H) 6.5 - 8.1 g/dL   Albumin 3.8 3.5 - 5.0 g/dL   AST 23 15 - 41 U/L   ALT 17 0 - 44 U/L   Alkaline Phosphatase 65 38 - 126 U/L   Total Bilirubin 1.1 0.0 - 1.2 mg/dL   GFR, Estimated >28 >41 mL/min    Comment: (NOTE) Calculated using the CKD-EPI Creatinine Equation (2021)    Anion gap 15 5 - 15    Comment: Performed at [facility], [address], [place], [place] [address]  Lipase, blood     Status: None   Collection Time: 06/01/23  6:00 PM  Result Value Ref Range   Lipase 25 11 - 51 U/L    Comment: Performed at [facility], [address] [place], [place] [address]  Urinalysis, Routine w reflex microscopic -     Status: Abnormal   Collection Time: 06/01/23  8:26 PM  Result Value Ref Range   Color, Urine AMBER (A) YELLOW    Comment: BIOCHEMICALS MAY BE AFFECTED BY COLOR   APPearance HAZY (A) CLEAR   Specific Gravity, Urine 1.035 (H) 1.005 - 1.030   pH 5.0 5.0 - 8.0   Glucose, UA NEGATIVE NEGATIVE mg/dL   Hgb urine dipstick NEGATIVE NEGATIVE   Bilirubin Urine NEGATIVE NEGATIVE   Ketones, ur 20 (A) NEGATIVE mg/dL   Protein, ur 536 (A) NEGATIVE mg/dL   Nitrite NEGATIVE NEGATIVE   Leukocytes,Ua NEGATIVE NEGATIVE   RBC / HPF 0-5 0 - 5 RBC/hpf   WBC, UA 0-5 0 - 5 WBC/hpf   Bacteria, UA NONE SEEN NONE SEEN   Squamous Epithelial / HPF 0-5 0 - 5 /HPF   Mucus PRESENT    Hyaline Casts, UA PRESENT     Comment: Performed at [facility], [address] [place], [place] [address]   CT ABDOMEN PELVIS W CONTRAST Result Date: 06/01/2023 CLINICAL  DATA:  Diverticulitis.  Bowel  obstruction suspected. EXAM: CT ABDOMEN AND PELVIS WITH CONTRAST TECHNIQUE: Multidetector CT imaging of the abdomen and pelvis was performed using the standard protocol following bolus administration of intravenous contrast. RADIATION DOSE REDUCTION: This exam was performed according to the departmental dose-optimization program which includes automated exposure control, adjustment of the mA and/or kV according to patient size and/or use of iterative reconstruction technique. CONTRAST:  OMNIPAQUE IOHEXOL 300 MG/ML  SOLN COMPARISON:  10/24/2022 FINDINGS: Lower chest: No acute abnormality. Hepatobiliary: 1.5 cm hyperenhancing lesion seen in the right hepatic lobe (image 19, series 4). It is not significantly changed since prior examination. The stability favors a benign etiology such as a hemangioma, however definitive diagnosis would require multiphasic CT or MRI. The liver, gallbladder, and bile ducts are otherwise unremarkable. Pancreas: Unremarkable. No pancreatic ductal dilatation or surrounding inflammatory changes. Spleen: Normal in size without focal abnormality. Adrenals/Urinary Tract: Adrenal glands are normal. Numerous bilateral simple cysts do not require dedicated imaging surveillance. The largest is located in the lower pole the right kidney measuring 7.1 cm. There are also numerous subcentimeter renal hypodensities which are too small to fully characterize. These also likely represent simple cysts and do not require dedicated imaging follow-up. No hydronephrosis or hydroureter. There is thickening of the bladder dome, greater to the left with adjacent fat stranding. There is a soft tissue tract extending from the sigmoid colon to the bladder, best seen on the coronal series images 81-95. This is suspicious for early fistula development. Stomach/Bowel: Small hiatal hernia. Stomach and doudenum otherwise unremarkable. Appendix is normal. Diffuse colonic dilatation with obstruction at the level of  the sigmoid colon, best seen on image 76 of series 4. There is mild adjacent fat stranding which could be secondary to ongoing diverticulitis, however a malignant obstruction at this site needs to be excluded by endoscopy. Vascular/Lymphatic: Calcified atheromatous plaque seen throughout the abdominal aorta and visualized branches. No enlarged abdominal or pelvic lymph nodes. Reproductive: Prostate is mildly enlarged nodular impressing upon the bladder neck. Testes appear incompletely descended, located within the inguinal canals bilaterally. Other: No abdominal wall hernia or abnormality. No abdominopelvic ascites. Musculoskeletal: Interval development of acute to subacute fractures at the posterolateral segments of the right ninth and tenth ribs. IMPRESSION: 1. Diffuse colonic dilatation with obstruction at the level of the sigmoid colon. There is mild adjacent fat stranding which could be secondary to ongoing diverticulitis, however a malignant obstruction at this site needs to be excluded by endoscopy. 2. Thickening of the bladder dome, greater to the left with adjacent fat stranding. There is a soft tissue tract extending from the sigmoid colon to the bladder, best seen on the coronal series. This is suspicious for early fistula development and cystitis. 3. Interval development of acute to subacute fractures at the posterolateral segments of the right ninth and tenth ribs. 4. 1.5 cm hyperenhancing lesion in the right hepatic lobe is not significantly changed since prior examination. The stability favors a benign etiology such as a hemangioma, however definitive diagnosis would require multiphasic CT or MRI. 5. Aortic atherosclerosis. Aortic Atherosclerosis (ICD10-I70.0). Electronically Signed   By: Acquanetta Belling M.D.   On: 06/01/2023 20:25    Pending Labs Unresulted Labs (From admission, onward)     Start     Ordered   06/02/23 0500  Basic metabolic panel  Tomorrow morning,   R        06/01/23 2336    06/02/23 0500  CBC  Tomorrow morning,  R        06/01/23 2336   06/02/23 0500  Magnesium  Tomorrow morning,   R        06/01/23 2336            Vitals/Pain Today's Vitals   06/01/23 1839 06/01/23 2000 06/01/23 2300 06/02/23 0000  BP:  (!) 156/78 (!) 143/77 (!) 156/72  Pulse:  62 64 64  Resp:  14 18 14   Temp:  97.9 F (36.6 C) 98 F (36.7 C)   TempSrc:   Oral   SpO2:  98% 100% 100%  Weight:      Height:      PainSc: 6        Isolation Precautions No active isolations  Medications Medications  acetaminophen (TYLENOL) tablet 650 mg (has no administration in time range)    Or  acetaminophen (TYLENOL) suppository 650 mg (has no administration in time range)  morphine (PF) 2 MG/ML injection 1 mg (has no administration in time range)  naloxone (NARCAN) injection 0.4 mg (has no administration in time range)  potassium chloride 10 mEq in 100 mL IVPB (10 mEq Intravenous New Bag/Given 06/01/23 2344)  0.9 %  sodium chloride infusion ( Intravenous New Bag/Given 06/01/23 2343)  trimethobenzamide (TIGAN) injection 200 mg (has no administration in time range)  ondansetron (ZOFRAN) injection 4 mg (4 mg Intravenous Given 06/01/23 1834)  sodium chloride 0.9 % bolus 1,000 mL (0 mLs Intravenous Stopped 06/01/23 2038)  fentaNYL (SUBLIMAZE) injection 50 mcg (50 mcg Intravenous Given 06/01/23 1839)  iohexol (OMNIPAQUE) 300 MG/ML solution 100 mL (100 mLs Intravenous Contrast Given 06/01/23 1929)    Mobility Walks with stand by assist.      Focused Assessments GI   R Recommendations: See Admitting Provider Note  Report given to:   Additional Notes: N/A

## 2023-06-02 NOTE — Consult Note (Signed)
 Referring Provider: Dr. Jacqulyn Bath Primary Care Physician:  [facility] [place] Primary Gastroenterologist:  Dr. Dulce Sellar  Reason for Consultation:  Colonic obstruction  HPI: Wayne Lowe is a 73 y.o. male with the acute change in bowel habits starting one week ago when he developed diffuse abdominal pain and distention and inability to move his bowels (except for a very small BM several days ago) when he normally goes daily. Vomited in ER one time. Denies rectal bleeding or melena. Denies any new meds prior to change in BMs. Last colonoscopy in 01/2016 showed left-sided diverticulosis. Diverticulitis in July 2024.   CT showing regarding colon:  1. Diffuse colonic dilatation with obstruction at the level of the sigmoid colon. There is mild adjacent fat stranding which could be secondary to ongoing diverticulitis, however a malignant obstruction at this site needs to be excluded by endoscopy.  Past Medical History:  Diagnosis Date   Anxiety    COVID-19    Diverticulitis    Dizziness    ED (erectile dysfunction) of non-organic origin    Enlarged prostate without lower urinary tract symptoms (luts)    Fluctuating blood pressure    History of BPH    HTN (hypertension)    Hypercholesteremia    Impaired fasting glucose    Memory changes    Moderate major depression (HCC)    Recurrent low back pain    Restless leg    Sleep disorder    Tremor    TSH deficiency     No past surgical history on file.  Prior to Admission medications   Medication Sig Start Date End Date Taking? Authorizing Provider  amLODipine (NORVASC) 5 MG tablet Take 5 mg by mouth daily.   Yes [provider]  finasteride (PROSCAR) 5 MG tablet Take 5 mg by mouth daily.   Yes [provider]  FLUoxetine (PROZAC) 40 MG capsule Take 80 mg by mouth every morning.   Yes [provider]  traZODone (DESYREL) 100 MG tablet Take 100 mg by mouth at bedtime.   Yes [provider]  amoxicillin-clavulanate (AUGMENTIN) 875-125 MG per tablet Take 1 tablet by mouth every 12 (twelve) hours. Patient not taking: Reported on 06/01/2023 02/01/12   Schinlever, Santina Evans, PA-C    Scheduled Meds: Continuous Infusions: PRN Meds:.acetaminophen **OR** acetaminophen, hydrALAZINE, morphine injection, naLOXone (NARCAN)  injection, prochlorperazine, trimethobenzamide  Allergies as of 06/01/2023 - Review Complete 06/01/2023  Allergen Reaction Noted   Buspar [buspirone]  01/21/2023   Crestor [rosuvastatin]  01/21/2023   Erythromycin  01/21/2023   Lipitor [atorvastatin]  01/21/2023   Lovastatin  01/21/2023   Pravachol [pravastatin]  01/21/2023   Wellbutrin [bupropion]  01/21/2023    No family history on file.  Social History   Socioeconomic History   Marital status: Married    Spouse name: Not on file   Number of children: Not on file   Years of education: Not on file   Highest education level: Not on file  Occupational History   Not on file  Tobacco Use   Smoking status: Former    Types: Cigarettes   Smokeless tobacco: Not on file  Substance and Sexual Activity   Alcohol use: No   Drug use: Not on file   Sexual activity: Not on file  Other Topics Concern   Not on file  Social History Narrative   Not on file   Social Drivers of Health   Financial Resource Strain: Not on file  Food Insecurity: No  Food Insecurity (06/02/2023)   Hunger Vital Sign    Worried About Running Out of Food in the Last Year: Never true    Ran Out of Food in the Last Year: Never true  Transportation Needs: No Transportation Needs (06/02/2023)   PRAPARE - Administrator, Civil Service (Medical): No    Lack of Transportation (Non-Medical): No  Physical Activity: Not on file  Stress: Not on file  Social Connections: Moderately Integrated (06/02/2023)   Social Connection and Isolation Panel [NHANES]    Frequency of Communication with Friends and Family: More than three  times a week    Frequency of Social Gatherings with Friends and Family: Twice a week    Attends Religious Services: More than 4 times per year    Active Member of [employer] or Organizations: No    Attends Banker Meetings: Never    Marital Status: Married  Catering manager Violence: Not At Risk (06/02/2023)   Humiliation, Afraid, Rape, and Kick questionnaire    Fear of Current or Ex-Partner: No    Emotionally Abused: No    Physically Abused: No    Sexually Abused: No    Review of Systems: All negative except as stated above in HPI.  Physical Exam: Vital signs: Vitals:   06/02/23 0459 06/02/23 0859  BP: (!) 166/81 134/76  Pulse: 75 73  Resp: 18 20  Temp: 97.7 F (36.5 C) 98 F (36.7 C)  SpO2: 99% 98%   Last BM Date : 05/28/23 General:   Lethargic, elderly, well-nourished, no acute distress  Head: normocephalic, atraumatic Eyes: anicteric sclera ENT: oropharynx clear Neck: supple, nontender Lungs:  Clear throughout to auscultation.   No wheezes, crackles, or rhonchi. No acute distress. Heart:  Regular rate and rhythm; no murmurs, clicks, rubs,  or gallops. Abdomen: distended, diffuse tenderness with guarding, soft, nondistended, +BS  Rectal:  Deferred Ext: no edema  GI:  Lab Results: Recent Labs    06/01/23 1800 06/02/23 0623  WBC 11.4* 10.9*  HGB 14.9 13.9  HCT 46.4 44.0  PLT 382 342   BMET Recent Labs    06/01/23 1800 06/02/23 0623  NA 137 140  K 3.2* 3.9  CL 103 110  CO2 19* 21*  GLUCOSE 133* 113*  BUN 20 16  CREATININE 1.02 0.77  CALCIUM 9.3 8.8*   LFT Recent Labs    06/01/23 1800  PROT 9.0*  ALBUMIN 3.8  AST 23  ALT 17  ALKPHOS 65  BILITOT 1.1   PT/INR No results for input(s): "LABPROT", "INR" in the last 72 hours.     Impression/Plan: Colonic obstruction in sigmoid colon - question malignancy vs diverticulitis but fecal impaction could have this appearance as well. Enemas today. Flexible sigmoidoscopy tomorrow by Dr.  Lorenso Quarry. NPO. Supportive care.     LOS: 1 day   Shirley Friar  06/02/2023, 1:20 PM  Questions please call [phone]

## 2023-06-02 NOTE — Progress Notes (Signed)
 Pt arrived to unit via stretcher room 1503. Alert and oriented x4 and oriented to room and callbell with no complications. Initial and 2 RN assessment completed. Plan of care ongoing.

## 2023-06-02 NOTE — Progress Notes (Signed)
 PROGRESS NOTE    Wayne Lowe  ZOX:[id number] DOB: 1951-02-20 DOA: 06/01/2023 PCP: [facility] [place]   Brief Narrative:  Wayne Lowe is a 73 y.o. male with medical history significant of hypertension, hyperlipidemia, diverticulitis, anxiety, depression, BPH, GERD presented to the ED for evaluation of abdominal pain/distention, nausea, vomiting, and constipation that is been going on for about a week.  Upon arrival to ED, he was hemodynamically stable.  CT abdomen showed colonic dilatation with obstruction, possible diverticulitis with suspicion of mass/malignancy as well as possibility of colovesical fistula.  Patient admitted under hospitalist service, GI consulted, GI recommended holding off antibiotics.  Assessment & Plan:   Principal Problem:   Bowel obstruction (HCC) Active Problems:   Colovesical fistula   Rib fractures   Hypokalemia   QT prolongation   Essential hypertension  Sigmoid colon obstruction / Diverticulitis versus possible malignant obstruction CT showing diffuse colonic dilation with obstruction at the level of the sigmoid colon and there is mild adjacent fat stranding which could be secondary to ongoing diverticulitis, however, a malignant obstruction needs to be excluded by endoscopy.  Borderline leukocytosis on labs, no fever or signs of sepsis.  [facility] will consult in the morning -recommended holding off antibiotics and also holding off NG tube at this time since patient is not actively vomiting.  Keep n.p.o.Avoid QT prolonging antiemetics.  Trend WBC count.   Colovesical fistula CT showing a soft tissue tract extending from the sigmoid colon to the bladder suspicious for early fistula development and cystitis.  However, UA is not suggestive of infection.  Will hold off antibiotics at this time per GI recommendations.   Right 9th and 10th rib fractures Discussed with the patient and fractures appear to be subacute as he reports slipping  on ice and falling 6 weeks ago.  Initially had pain at that time but no longer having any rib pain or discomfort now.   Right hepatic lobe lesion CT showing a stable 1.5 cm hyperenhancing lesion in the right hepatic lobe favored to be a hemangioma per radiologist.  However, definitive diagnosis would require multiphasic CT or MRI.  GI consulted.   Mild hypokalemia QT prolongation QTc 521 on EKG.  Monitor potassium and magnesium levels, continue to replace as needed.  Avoid QT prolonging drugs and follow-up repeat EKG in the morning.   Mild metabolic acidosis Improving, will resume IV fluids.     Hypertension SBP currently in the 150s.  IV hydralazine PRN SBP >160.  DVT prophylaxis: SCDs Start: 06/01/23 2334   Code Status: Full Code  Family Communication:  None present at bedside.  Plan of care discussed with patient in length and he/she verbalized understanding and agreed with it.  Status is: Inpatient Remains inpatient appropriate because: Awaits GI evaluation, will likely need colonoscopy.   Estimated body mass index is 28.8 kg/m as calculated from the following:   Height as of this encounter: 5\' 9"  (1.753 m).   Weight as of this encounter: 88.5 kg.    Nutritional Assessment: Body mass index is 28.8 kg/m.Marland Kitchen Seen by dietician.  I agree with the assessment and plan as outlined below: Nutrition Status:        . Skin Assessment: I have examined the patient's skin and I agree with the wound assessment as performed by the wound care RN as outlined below:    Consultants:  GI  Procedures:  None  Antimicrobials:  Anti-infectives (From admission, onward)    None  Subjective: Patient seen and examined, complains of abdominal distention and pain which is slightly improved compared to yesterday.  Not passing gas.  Objective: Vitals:   06/01/23 2300 06/02/23 0000 06/02/23 0127 06/02/23 0459  BP: (!) 143/77 (!) 156/72 (!) 162/83 (!) 166/81  Pulse: 64 64 (!)  58 75  Resp: 18 14 18 18   Temp: 98 F (36.7 C)  97.9 F (36.6 C) 97.7 F (36.5 C)  TempSrc: Oral  Oral Oral  SpO2: 100% 100% 99% 99%  Weight:      Height:        Intake/Output Summary (Last 24 hours) at 06/02/2023 0829 Last data filed at 06/02/2023 0746 Gross per 24 hour  Intake 0 ml  Output 1200 ml  Net -1200 ml   Filed Weights   06/01/23 1333  Weight: 88.5 kg    Examination:  General exam: Appears calm and comfortable  Respiratory system: Clear to auscultation. Respiratory effort normal. Cardiovascular system: S1 & S2 heard, RRR. No JVD, murmurs, rubs, gallops or clicks. No pedal edema. Gastrointestinal system: Abdomen is soft but moderately distended and nontender. No organomegaly or masses felt. Normal bowel sounds heard. Central nervous system: Alert and oriented. No focal neurological deficits. Extremities: Symmetric 5 x 5 power. Skin: No rashes, lesions or ulcers Psychiatry: Judgement and insight appear normal. Mood & affect appropriate.    Data Reviewed: I have personally reviewed following labs and imaging studies  CBC: Recent Labs  Lab 06/01/23 1800 06/02/23 0623  WBC 11.4* 10.9*  NEUTROABS 9.6*  --   HGB 14.9 13.9  HCT 46.4 44.0  MCV 91.2 93.2  PLT 382 342   Basic Metabolic Panel: Recent Labs  Lab 06/01/23 1800 06/02/23 0623  NA 137 140  K 3.2* 3.9  CL 103 110  CO2 19* 21*  GLUCOSE 133* 113*  BUN 20 16  CREATININE 1.02 0.77  CALCIUM 9.3 8.8*  MG  --  2.4   GFR: Estimated Creatinine Clearance: 89.1 mL/min (by C-G formula based on SCr of 0.77 mg/dL). Liver Function Tests: Recent Labs  Lab 06/01/23 1800  AST 23  ALT 17  ALKPHOS 65  BILITOT 1.1  PROT 9.0*  ALBUMIN 3.8   Recent Labs  Lab 06/01/23 1800  LIPASE 25   No results for input(s): "AMMONIA" in the last 168 hours. Coagulation Profile: No results for input(s): "INR", "PROTIME" in the last 168 hours. Cardiac Enzymes: No results for input(s): "CKTOTAL", "CKMB", "CKMBINDEX",  "TROPONINI" in the last 168 hours. BNP (last 3 results) No results for input(s): "PROBNP" in the last 8760 hours. HbA1C: No results for input(s): "HGBA1C" in the last 72 hours. CBG: No results for input(s): "GLUCAP" in the last 168 hours. Lipid Profile: No results for input(s): "CHOL", "HDL", "LDLCALC", "TRIG", "CHOLHDL", "LDLDIRECT" in the last 72 hours. Thyroid Function Tests: No results for input(s): "TSH", "T4TOTAL", "FREET4", "T3FREE", "THYROIDAB" in the last 72 hours. Anemia Panel: No results for input(s): "VITAMINB12", "FOLATE", "FERRITIN", "TIBC", "IRON", "RETICCTPCT" in the last 72 hours. Sepsis Labs: No results for input(s): "PROCALCITON", "LATICACIDVEN" in the last 168 hours.  No results found for this or any previous visit (from the past 240 hours).   Radiology Studies: CT ABDOMEN PELVIS W CONTRAST Result Date: 06/01/2023 CLINICAL DATA:  Diverticulitis.  Bowel obstruction suspected. EXAM: CT ABDOMEN AND PELVIS WITH CONTRAST TECHNIQUE: Multidetector CT imaging of the abdomen and pelvis was performed using the standard protocol following bolus administration of intravenous contrast. RADIATION DOSE REDUCTION: This exam was performed according to  the departmental dose-optimization program which includes automated exposure control, adjustment of the mA and/or kV according to patient size and/or use of iterative reconstruction technique. CONTRAST:  OMNIPAQUE IOHEXOL 300 MG/ML  SOLN COMPARISON:  10/24/2022 FINDINGS: Lower chest: No acute abnormality. Hepatobiliary: 1.5 cm hyperenhancing lesion seen in the right hepatic lobe (image 19, series 4). It is not significantly changed since prior examination. The stability favors a benign etiology such as a hemangioma, however definitive diagnosis would require multiphasic CT or MRI. The liver, gallbladder, and bile ducts are otherwise unremarkable. Pancreas: Unremarkable. No pancreatic ductal dilatation or surrounding inflammatory changes.  Spleen: Normal in size without focal abnormality. Adrenals/Urinary Tract: Adrenal glands are normal. Numerous bilateral simple cysts do not require dedicated imaging surveillance. The largest is located in the lower pole the right kidney measuring 7.1 cm. There are also numerous subcentimeter renal hypodensities which are too small to fully characterize. These also likely represent simple cysts and do not require dedicated imaging follow-up. No hydronephrosis or hydroureter. There is thickening of the bladder dome, greater to the left with adjacent fat stranding. There is a soft tissue tract extending from the sigmoid colon to the bladder, best seen on the coronal series images 81-95. This is suspicious for early fistula development. Stomach/Bowel: Small hiatal hernia. Stomach and doudenum otherwise unremarkable. Appendix is normal. Diffuse colonic dilatation with obstruction at the level of the sigmoid colon, best seen on image 76 of series 4. There is mild adjacent fat stranding which could be secondary to ongoing diverticulitis, however a malignant obstruction at this site needs to be excluded by endoscopy. Vascular/Lymphatic: Calcified atheromatous plaque seen throughout the abdominal aorta and visualized branches. No enlarged abdominal or pelvic lymph nodes. Reproductive: Prostate is mildly enlarged nodular impressing upon the bladder neck. Testes appear incompletely descended, located within the inguinal canals bilaterally. Other: No abdominal wall hernia or abnormality. No abdominopelvic ascites. Musculoskeletal: Interval development of acute to subacute fractures at the posterolateral segments of the right ninth and tenth ribs. IMPRESSION: 1. Diffuse colonic dilatation with obstruction at the level of the sigmoid colon. There is mild adjacent fat stranding which could be secondary to ongoing diverticulitis, however a malignant obstruction at this site needs to be excluded by endoscopy. 2. Thickening of the  bladder dome, greater to the left with adjacent fat stranding. There is a soft tissue tract extending from the sigmoid colon to the bladder, best seen on the coronal series. This is suspicious for early fistula development and cystitis. 3. Interval development of acute to subacute fractures at the posterolateral segments of the right ninth and tenth ribs. 4. 1.5 cm hyperenhancing lesion in the right hepatic lobe is not significantly changed since prior examination. The stability favors a benign etiology such as a hemangioma, however definitive diagnosis would require multiphasic CT or MRI. 5. Aortic atherosclerosis. Aortic Atherosclerosis (ICD10-I70.0). Electronically Signed   By: Acquanetta Belling M.D.   On: 06/01/2023 20:25    Scheduled Meds: Continuous Infusions:  sodium chloride 125 mL/hr at 06/01/23 2343     LOS: 1 day   Hughie Closs, MD [facility]  06/02/2023, 8:29 AM   *Please note that this is a verbal dictation therefore any spelling or grammatical errors are due to the "Dragon [employer] One" system interpretation.  Please page via Amion and do not message via secure chat for urgent patient care matters. Secure chat can be used for non urgent patient care matters.  How to contact the [facility] Attending or Consulting provider 7A -  7P or covering provider during after hours 7P -7A, for this patient?  Check the care team in [facility] and look for a) attending/consulting [facility] provider listed and b) the [facility] team listed. Page or secure chat 7A-7P. Log into [email or web address] and use [facility]'s universal password to access. If you do not have the password, please contact the hospital operator. Locate the [facility] provider you are looking for under [facility] and page to a number that you can be directly reached. If you still have difficulty reaching the provider, please page the [facility] (Director on Call) for the Hospitalists listed on amion for assistance.

## 2023-06-03 ENCOUNTER — Inpatient Hospital Stay (HOSPITAL_COMMUNITY): Admitting: Anesthesiology

## 2023-06-03 ENCOUNTER — Encounter (HOSPITAL_COMMUNITY)
Admission: EM | Disposition: A | Discharge status: Home Health | Payer: Self-pay | Source: Home / Self Care | Attending: Family Medicine

## 2023-06-03 DIAGNOSIS — K644 Residual hemorrhoidal skin tags: Secondary | ICD-10-CM

## 2023-06-03 DIAGNOSIS — E039 Hypothyroidism, unspecified: Secondary | ICD-10-CM

## 2023-06-03 DIAGNOSIS — K56609 Unspecified intestinal obstruction, unspecified as to partial versus complete obstruction: Secondary | ICD-10-CM | POA: Diagnosis not present

## 2023-06-03 DIAGNOSIS — I10 Essential (primary) hypertension: Secondary | ICD-10-CM | POA: Diagnosis not present

## 2023-06-03 DIAGNOSIS — K56699 Other intestinal obstruction unspecified as to partial versus complete obstruction: Secondary | ICD-10-CM

## 2023-06-03 HISTORY — PX: FLEXIBLE SIGMOIDOSCOPY: SHX5431

## 2023-06-03 LAB — CBC WITH DIFFERENTIAL/PLATELET
Abs Immature Granulocytes: 0.04 10*3/uL (ref 0.00–0.07)
Basophils Absolute: 0 10*3/uL (ref 0.0–0.1)
Basophils Relative: 0 %
Eosinophils Absolute: 0 10*3/uL (ref 0.0–0.5)
Eosinophils Relative: 0 %
HCT: 43.5 % (ref 39.0–52.0)
Hemoglobin: 14 g/dL (ref 13.0–17.0)
Immature Granulocytes: 0 %
Lymphocytes Relative: 8 %
Lymphs Abs: 0.8 10*3/uL (ref 0.7–4.0)
MCH: 29.9 pg (ref 26.0–34.0)
MCHC: 32.2 g/dL (ref 30.0–36.0)
MCV: 92.8 fL (ref 80.0–100.0)
Monocytes Absolute: 1 10*3/uL (ref 0.1–1.0)
Monocytes Relative: 10 %
Neutro Abs: 8.4 10*3/uL — ABNORMAL HIGH (ref 1.7–7.7)
Neutrophils Relative %: 82 %
Platelets: 342 10*3/uL (ref 150–400)
RBC: 4.69 MIL/uL (ref 4.22–5.81)
RDW: 13.5 % (ref 11.5–15.5)
WBC: 10.2 10*3/uL (ref 4.0–10.5)
nRBC: 0 % (ref 0.0–0.2)

## 2023-06-03 LAB — BASIC METABOLIC PANEL
Anion gap: 9 (ref 5–15)
BUN: 15 mg/dL (ref 8–23)
CO2: 22 mmol/L (ref 22–32)
Calcium: 9.1 mg/dL (ref 8.9–10.3)
Chloride: 108 mmol/L (ref 98–111)
Creatinine, Ser: 0.7 mg/dL (ref 0.61–1.24)
GFR, Estimated: >60 mL/min (ref >60)
Glucose, Bld: 111 mg/dL — ABNORMAL HIGH (ref 70–99)
Potassium: 4.1 mmol/L (ref 3.5–5.1)
Sodium: 139 mmol/L (ref 135–145)

## 2023-06-03 SURGERY — SIGMOIDOSCOPY, FLEXIBLE
Anesthesia: Monitor Anesthesia Care

## 2023-06-03 MED ORDER — SODIUM CHLORIDE 0.9 % IV SOLN
INTRAVENOUS | Status: AC
Start: 2023-06-03 — End: 2023-06-03

## 2023-06-03 MED ORDER — DIPHENHYDRAMINE HCL 50 MG/ML IJ SOLN
12.5000 mg | Freq: Every evening | INTRAMUSCULAR | Status: DC | PRN
Start: 2023-06-03 — End: 2023-06-10
  Administered 2023-06-05: 25 mg via INTRAVENOUS
  Filled 2023-06-03: qty 1

## 2023-06-03 MED ORDER — ACETAMINOPHEN 10 MG/ML IV SOLN
1000.0000 mg | Freq: Four times a day (QID) | INTRAVENOUS | Status: AC
  Administered 2023-06-03 – 2023-06-04 (×3): 1000 mg via INTRAVENOUS
  Filled 2023-06-03 (×3): qty 100

## 2023-06-03 MED ORDER — LORAZEPAM 2 MG/ML IJ SOLN
0.5000 mg | Freq: Four times a day (QID) | INTRAMUSCULAR | Status: DC | PRN
  Administered 2023-06-03 – 2023-06-06 (×4): 0.5 mg via INTRAVENOUS
  Filled 2023-06-03 (×4): qty 1

## 2023-06-03 MED ORDER — PROPOFOL 500 MG/50ML IV EMUL
INTRAVENOUS | Status: DC | PRN
  Administered 2023-06-03: 150 ug/kg/min via INTRAVENOUS

## 2023-06-03 MED ORDER — MORPHINE SULFATE (PF) 2 MG/ML IV SOLN
2.0000 mg | INTRAVENOUS | Status: DC | PRN
  Administered 2023-06-03 – 2023-06-05 (×3): 2 mg via INTRAVENOUS
  Filled 2023-06-03 (×3): qty 1

## 2023-06-03 NOTE — Progress Notes (Signed)
   06/03/23 1511  TOC Brief Assessment  Insurance and Status Reviewed  Patient has primary care physician Yes  Home environment has been reviewed Home w/ spouse  Prior level of function: Independent  Prior/Current Home Services No current home services  Social Drivers of Health Review SDOH reviewed no interventions necessary  Readmission risk has been reviewed Yes  Transition of care needs no transition of care needs at this time

## 2023-06-03 NOTE — Anesthesia Preprocedure Evaluation (Addendum)
 Anesthesia Evaluation  Patient identified by MRN, date of birth, ID band Patient awake    Reviewed: Allergy & Precautions, NPO status , Patient's Chart, lab work & pertinent test results  History of Anesthesia Complications Negative for: history of anesthetic complications  Airway Mallampati: II  TM Distance: >3 FB Neck ROM: Full    Dental  (+) Dental Advisory Given, Edentulous Upper   Pulmonary former smoker   Pulmonary exam normal        Cardiovascular hypertension, Pt. on medications Normal cardiovascular exam     Neuro/Psych  PSYCHIATRIC DISORDERS Anxiety Depression    negative neurological ROS     GI/Hepatic negative GI ROS, Neg liver ROS,,,  Endo/Other  Hypothyroidism    Renal/GU negative Renal ROS     Musculoskeletal negative musculoskeletal ROS (+)    Abdominal   Peds  Hematology negative hematology ROS (+)   Anesthesia Other Findings   Reproductive/Obstetrics                             Anesthesia Physical Anesthesia Plan  ASA: 2  Anesthesia Plan: MAC   Post-op Pain Management: Minimal or no pain anticipated   Induction:   PONV Risk Score and Plan: 1 and Propofol infusion and Treatment may vary due to age or medical condition  Airway Management Planned: Natural Airway and Simple Face Mask  Additional Equipment: None  Intra-op Plan:   Post-operative Plan:   Informed Consent: I have reviewed the patients History and Physical, chart, labs and discussed the procedure including the risks, benefits and alternatives for the proposed anesthesia with the patient or authorized representative who has indicated his/her understanding and acceptance.       Plan Discussed with: CRNA and Anesthesiologist  Anesthesia Plan Comments:        Anesthesia Quick Evaluation

## 2023-06-03 NOTE — Anesthesia Postprocedure Evaluation (Signed)
 Anesthesia Post Note  Patient: Wayne Lowe  Procedure(s) Performed: FLEXIBLE SIGMOIDOSCOPY     Patient location during evaluation: PACU Anesthesia Type: MAC Level of consciousness: awake and alert Pain management: pain level controlled Vital Signs Assessment: post-procedure vital signs reviewed and stable Respiratory status: spontaneous breathing, nonlabored ventilation and respiratory function stable Cardiovascular status: stable and blood pressure returned to baseline Anesthetic complications: no   No notable events documented.  Last Vitals:  Vitals:   06/03/23 1340 06/03/23 1345  BP: (!) 168/102 (!) 169/100  Pulse: 69 74  Resp: (!) 22 12  Temp:    SpO2: 98% 100%    Last Pain:  Vitals:   06/03/23 1329  TempSrc: Temporal  PainSc:                  [name]

## 2023-06-03 NOTE — Op Note (Signed)
 [facility] Patient Name: Wayne Lowe Procedure Date: 06/03/2023 MRN: [id number] Attending MD: Liliane Shi DO, DO, [id number] Date of Birth: May 02, 1950 CSN: [id number] Age: 73 Admit Type: Inpatient Procedure:                Flexible Sigmoidoscopy Indications:              Abdominal pain in the left lower quadrant, Abnormal                            CT of the GI tract, Suspected colonic obstruction Providers:                Liliane Shi DO, DO, Suzy Bouchard, RN, Sunday Corn                            [place], Technician Referring MD:              Medicines:                See the Anesthesia note for documentation of the                            administered medications Complications:            No immediate complications. Estimated Blood Loss:     Estimated blood loss: none. Procedure:                Pre-Anesthesia Assessment:                           - ASA Grade Assessment: II - A patient with mild                            systemic disease.                           - The risks and benefits of the procedure and the                            sedation options and risks were discussed with the                            patient. All questions were answered and informed                            consent was obtained.                           After obtaining informed consent, the scope was                            passed under direct vision. The [id number]-HQ190L                            ([id number]) Olympus colonoscope was introduced                            through the anus and  advanced to the 30 cm from the                            anal verge. The flexible sigmoidoscopy was                            accomplished without difficulty. The patient                            tolerated the procedure well. The quality of the                            bowel preparation was poor. Scope In: Scope Out: Findings:      The digital rectal exam findings include  non-thrombosed external       hemorrhoids.      A moderate amount of liquid semi-liquid semi-solid stool was found in       the rectum and in the recto-sigmoid colon, precluding visualization.       Lavage of the area was performed using a small amount, resulting in       incomplete clearance with continued poor visualization.      A benign-appearing, intrinsic severe stenosis measuring of unknown       length was found in the sigmoid colon and was non-traversed. Colonic       tissue at 30 cm appeared pale and somewhat boggy in appearance,       concerning for ischemia. Impression:               - Preparation of the colon was poor.                           - Non-thrombosed external hemorrhoids found on                            digital rectal exam.                           - Stool in the rectum and in the recto-sigmoid                            colon.                           - Stricture in the sigmoid colon.                           - No specimens collected. Moderate Sedation:      See the other procedure note for documentation of moderate sedation with       intraservice time. Recommendation:           - Return patient to hospital ward for ongoing care.                           - Recommend surgical consultation given concern for                            bowel ischemia on current  study. Discussed this                            with Internal Medicine team.                           - NPO until can be evaluated by General Surgery                            team.                           - Monitor bowel movements.                           [facility] will follow. Procedure Code(s):        --- Professional ---                           [phone], Sigmoidoscopy, flexible; diagnostic,                            including collection of specimen(s) by brushing or                            washing, when performed (separate procedure) Diagnosis Code(s):        --- Professional ---                            K64.4, Residual hemorrhoidal skin tags                           K56.699, Other intestinal obstruction unspecified                            as to partial versus complete obstruction                           R10.32, Left lower quadrant pain                           R93.3, Abnormal findings on diagnostic imaging of                            other parts of digestive tract CPT copyright 2022 [employer]. All rights reserved. The codes documented in this report are preliminary and upon coder review may  be revised to meet current compliance requirements. Dr Liliane Shi, DO Liliane Shi DO, DO 06/03/2023 1:38:46 PM Number of Addenda: 0

## 2023-06-03 NOTE — Consult Note (Signed)
 Wayne Lowe [birth year]  [id number].    Requesting MD: Jacqulyn Bath, MD Chief Complaint/Reason for Consult: sigmoid stricture   HPI:  Wayne Lowe is a 73 y/o M who presents with abdominal pain, distention, nausea, and vomiting. Reports abdominal distention and discomfort started over one week ago and became progressively worse. His last BM was one week ago, took ex lax without relief. Prior to this he reports daily BMs without use of laxatives. He also reports poor PO intake for over a week. CT scan was significant for diffuse colonic dilation with obstruction at the level of the sigmoid colon. He was admitted and underwent colonoscopy 3/3 where a benign appearing sigmoid stricture was unable to be traversed. Surgery I asked to consult for consideration of partial colectomy.  Patient history significant for HTN, HLD, diverticulitis, anxiety, and BPH. Patient denies use of blood thinners. Denies a history of abdominal surgery. He is a former smoker. Denies other alcohol or drug use. Last colonoscopy in 01/2016 showed left-sided diverticulosis. Reports diverticulitis in fall of 2024 that required 3 outpatient doses of antibiotics. At baseline he is independent of ADLs and plays golf. Denies exertional chest pain. He denies fever, chills, he has chronic urinary sxs due to BPH.    ROS: Review of Systems  All other systems reviewed and are negative.   No family history on file.  Past Medical History:  Diagnosis Date   Anxiety    COVID-19    Diverticulitis    Dizziness    ED (erectile dysfunction) of non-organic origin    Enlarged prostate without lower urinary tract symptoms (luts)    Fluctuating blood pressure    History of BPH    HTN (hypertension)    Hypercholesteremia    Impaired fasting glucose    Memory changes    Moderate major depression (HCC)    Recurrent low back pain    Restless leg    Sleep disorder    Tremor    TSH deficiency     No past surgical history on  file.  Social History:  reports that he has quit smoking. His smoking use included cigarettes. He does not have any smokeless tobacco history on file. He reports that he does not drink alcohol. No history on file for drug use.  Allergies:  Allergies  Allergen Reactions   Buspar [Buspirone]     EXACERBATED IBS    Crestor [Rosuvastatin]     BODY ACHES   Erythromycin     GI DISTRESS   Lipitor [Atorvastatin]     JOINT PAIN    Lovastatin     MUSCLE ACHES    Pravachol [Pravastatin]     MM WEAKNESS   Wellbutrin [Bupropion]     Medications Prior to Admission  Medication Sig Dispense Refill   amLODipine (NORVASC) 5 MG tablet Take 5 mg by mouth daily.     finasteride (PROSCAR) 5 MG tablet Take 5 mg by mouth daily.     FLUoxetine (PROZAC) 40 MG capsule Take 80 mg by mouth every morning.     traZODone (DESYREL) 100 MG tablet Take 100 mg by mouth at bedtime.     amoxicillin-clavulanate (AUGMENTIN) 875-125 MG per tablet Take 1 tablet by mouth every 12 (twelve) hours. (Patient not taking: Reported on 06/01/2023) 14 tablet 0     Physical Exam: Blood pressure (!) 160/91, pulse 66, temperature 97.9 F (36.6 C), resp. rate 12, height 5\' 9"  (1.753 m), weight 88.5 kg, SpO2 99%. General: Pleasant white  male, appears uncomfortable, cooperative  HEENT: head -normocephalic, atraumatic; Eyes: PERRLA, anicteric sclerae  Neck- Trachea is midline, no thyromegaly or JVD appreciated.  CV- RRR, normal S1/S2, no M/R/G, no lower extremity edema  Pulm- breathing is non-labored on nasal cannula, CTABL, no wheezes, rhales, rhonchi. Abd- soft, moderate to severe distention, overall non-tender, no guarding, no hernias or masses GU- deferred  MSK- UE/LE symmetrical, no cyanosis, clubbing, or edema. Neuro- CN II-XII grossly in tact, no paresthesias. Psych- Alert and Oriented x3 with appropriate affect Skin: warm and dry, no rashes or lesions   Results for orders placed or performed during the hospital  encounter of 06/01/23 (from the past 48 hours)  CBC with Differential     Status: Abnormal   Collection Time: 06/01/23  6:00 PM  Result Value Ref Range   WBC 11.4 (H) 4.0 - 10.5 K/uL   RBC 5.09 4.22 - 5.81 MIL/uL   Hemoglobin 14.9 13.0 - 17.0 g/dL   HCT 82.9 56.2 - 13.0 %   MCV 91.2 80.0 - 100.0 fL   MCH 29.3 26.0 - 34.0 pg   MCHC 32.1 30.0 - 36.0 g/dL   RDW 86.5 78.4 - 69.6 %   Platelets 382 150 - 400 K/uL   nRBC 0.0 0.0 - 0.2 %   Neutrophils Relative % 86 %   Neutro Abs 9.6 (H) 1.7 - 7.7 K/uL   Lymphocytes Relative 7 %   Lymphs Abs 0.8 0.7 - 4.0 K/uL   Monocytes Relative 7 %   Monocytes Absolute 0.8 0.1 - 1.0 K/uL   Eosinophils Relative 0 %   Eosinophils Absolute 0.0 0.0 - 0.5 K/uL   Basophils Relative 0 %   Basophils Absolute 0.0 0.0 - 0.1 K/uL   Immature Granulocytes 0 %   Abs Immature Granulocytes 0.04 0.00 - 0.07 K/uL    Comment: Performed at [facility], [address], [place], [place] [address]  Comprehensive metabolic panel     Status: Abnormal   Collection Time: 06/01/23  6:00 PM  Result Value Ref Range   Sodium 137 135 - 145 mmol/L   Potassium 3.2 (L) 3.5 - 5.1 mmol/L   Chloride 103 98 - 111 mmol/L   CO2 19 (L) 22 - 32 mmol/L   Glucose, Bld 133 (H) 70 - 99 mg/dL    Comment: Glucose reference range applies only to samples taken after fasting for at least 8 hours.   BUN 20 8 - 23 mg/dL   Creatinine, Ser 4.13 0.61 - 1.24 mg/dL   Calcium 9.3 8.9 - 24.4 mg/dL   Total Protein 9.0 (H) 6.5 - 8.1 g/dL   Albumin 3.8 3.5 - 5.0 g/dL   AST 23 15 - 41 U/L   ALT 17 0 - 44 U/L   Alkaline Phosphatase 65 38 - 126 U/L   Total Bilirubin 1.1 0.0 - 1.2 mg/dL   GFR, Estimated >01 >02 mL/min    Comment: (NOTE) Calculated using the CKD-EPI Creatinine Equation (2021)    Anion gap 15 5 - 15    Comment: Performed at [facility], [address] [place], [place] [address]  Lipase, blood     Status: None   Collection Time: 06/01/23  6:00 PM   Result Value Ref Range   Lipase 25 11 - 51 U/L    Comment: Performed at [facility], [address], [place], [place] [address]  Urinalysis, Routine w reflex microscopic -     Status: Abnormal  Collection Time: 06/01/23  8:26 PM  Result Value Ref Range   Color, Urine AMBER (A) YELLOW    Comment: BIOCHEMICALS MAY BE AFFECTED BY COLOR   APPearance HAZY (A) CLEAR   Specific Gravity, Urine 1.035 (H) 1.005 - 1.030   pH 5.0 5.0 - 8.0   Glucose, UA NEGATIVE NEGATIVE mg/dL   Hgb urine dipstick NEGATIVE NEGATIVE   Bilirubin Urine NEGATIVE NEGATIVE   Ketones, ur 20 (A) NEGATIVE mg/dL   Protein, ur 962 (A) NEGATIVE mg/dL   Nitrite NEGATIVE NEGATIVE   Leukocytes,Ua NEGATIVE NEGATIVE   RBC / HPF 0-5 0 - 5 RBC/hpf   WBC, UA 0-5 0 - 5 WBC/hpf   Bacteria, UA NONE SEEN NONE SEEN   Squamous Epithelial / HPF 0-5 0 - 5 /HPF   Mucus PRESENT    Hyaline Casts, UA PRESENT     Comment: Performed at [facility], [address] [place], [place] [address]  Basic metabolic panel     Status: Abnormal   Collection Time: 06/02/23  6:23 AM  Result Value Ref Range   Sodium 140 135 - 145 mmol/L   Potassium 3.9 3.5 - 5.1 mmol/L   Chloride 110 98 - 111 mmol/L   CO2 21 (L) 22 - 32 mmol/L   Glucose, Bld 113 (H) 70 - 99 mg/dL    Comment: Glucose reference range applies only to samples taken after fasting for at least 8 hours.   BUN 16 8 - 23 mg/dL   Creatinine, Ser 1.32 0.61 - 1.24 mg/dL   Calcium 8.8 (L) 8.9 - 10.3 mg/dL   GFR, Estimated >44 >01 mL/min    Comment: (NOTE) Calculated using the CKD-EPI Creatinine Equation (2021)    Anion gap 9 5 - 15    Comment: Performed at [facility], [address], [place], [place] [address]  CBC     Status: Abnormal   Collection Time: 06/02/23  6:23 AM  Result Value Ref Range   WBC 10.9 (H) 4.0 - 10.5 K/uL   RBC 4.72 4.22 - 5.81 MIL/uL   Hemoglobin 13.9 13.0 - 17.0 g/dL   HCT 36.6 44.0 - 34.7 %   MCV 93.2  80.0 - 100.0 fL   MCH 29.4 26.0 - 34.0 pg   MCHC 31.6 30.0 - 36.0 g/dL   RDW 42.5 95.6 - 38.7 %   Platelets 342 150 - 400 K/uL   nRBC 0.0 0.0 - 0.2 %    Comment: Performed at [facility], [address], [place], [place] [address]  Magnesium     Status: None   Collection Time: 06/02/23  6:23 AM  Result Value Ref Range   Magnesium 2.4 1.7 - 2.4 mg/dL    Comment: Performed at [facility], [address], [place], [place] [address]  CBC with Differential/Platelet     Status: Abnormal   Collection Time: 06/03/23  6:13 AM  Result Value Ref Range   WBC 10.2 4.0 - 10.5 K/uL   RBC 4.69 4.22 - 5.81 MIL/uL   Hemoglobin 14.0 13.0 - 17.0 g/dL   HCT 84.1 66.0 - 63.0 %   MCV 92.8 80.0 - 100.0 fL   MCH 29.9 26.0 - 34.0 pg   MCHC 32.2 30.0 - 36.0 g/dL   RDW 16.0 10.9 - 32.3 %   Platelets 342 150 - 400 K/uL   nRBC 0.0 0.0 - 0.2 %   Neutrophils Relative % 82 %   Neutro Abs  8.4 (H) 1.7 - 7.7 K/uL   Lymphocytes Relative 8 %   Lymphs Abs 0.8 0.7 - 4.0 K/uL   Monocytes Relative 10 %   Monocytes Absolute 1.0 0.1 - 1.0 K/uL   Eosinophils Relative 0 %   Eosinophils Absolute 0.0 0.0 - 0.5 K/uL   Basophils Relative 0 %   Basophils Absolute 0.0 0.0 - 0.1 K/uL   Immature Granulocytes 0 %   Abs Immature Granulocytes 0.04 0.00 - 0.07 K/uL    Comment: Performed at [facility], [address], [place], [place] [address]  Basic metabolic panel     Status: Abnormal   Collection Time: 06/03/23  6:13 AM  Result Value Ref Range   Sodium 139 135 - 145 mmol/L   Potassium 4.1 3.5 - 5.1 mmol/L   Chloride 108 98 - 111 mmol/L   CO2 22 22 - 32 mmol/L   Glucose, Bld 111 (H) 70 - 99 mg/dL    Comment: Glucose reference range applies only to samples taken after fasting for at least 8 hours.   BUN 15 8 - 23 mg/dL   Creatinine, Ser 5.78 0.61 - 1.24 mg/dL   Calcium 9.1 8.9 - 46.9 mg/dL   GFR, Estimated >62 >95 mL/min    Comment: (NOTE) Calculated using the  CKD-EPI Creatinine Equation (2021)    Anion gap 9 5 - 15    Comment: Performed at [facility], [address], [place], [place] [address]   CT ABDOMEN PELVIS W CONTRAST Result Date: 06/01/2023 CLINICAL DATA:  Diverticulitis.  Bowel obstruction suspected. EXAM: CT ABDOMEN AND PELVIS WITH CONTRAST TECHNIQUE: Multidetector CT imaging of the abdomen and pelvis was performed using the standard protocol following bolus administration of intravenous contrast. RADIATION DOSE REDUCTION: This exam was performed according to the departmental dose-optimization program which includes automated exposure control, adjustment of the mA and/or kV according to patient size and/or use of iterative reconstruction technique. CONTRAST:  OMNIPAQUE IOHEXOL 300 MG/ML  SOLN COMPARISON:  10/24/2022 FINDINGS: Lower chest: No acute abnormality. Hepatobiliary: 1.5 cm hyperenhancing lesion seen in the right hepatic lobe (image 19, series 4). It is not significantly changed since prior examination. The stability favors a benign etiology such as a hemangioma, however definitive diagnosis would require multiphasic CT or MRI. The liver, gallbladder, and bile ducts are otherwise unremarkable. Pancreas: Unremarkable. No pancreatic ductal dilatation or surrounding inflammatory changes. Spleen: Normal in size without focal abnormality. Adrenals/Urinary Tract: Adrenal glands are normal. Numerous bilateral simple cysts do not require dedicated imaging surveillance. The largest is located in the lower pole the right kidney measuring 7.1 cm. There are also numerous subcentimeter renal hypodensities which are too small to fully characterize. These also likely represent simple cysts and do not require dedicated imaging follow-up. No hydronephrosis or hydroureter. There is thickening of the bladder dome, greater to the left with adjacent fat stranding. There is a soft tissue tract extending from the sigmoid colon to the bladder,  best seen on the coronal series images 81-95. This is suspicious for early fistula development. Stomach/Bowel: Small hiatal hernia. Stomach and doudenum otherwise unremarkable. Appendix is normal. Diffuse colonic dilatation with obstruction at the level of the sigmoid colon, best seen on image 76 of series 4. There is mild adjacent fat stranding which could be secondary to ongoing diverticulitis, however a malignant obstruction at this site needs to be excluded by endoscopy. Vascular/Lymphatic: Calcified atheromatous plaque seen throughout the abdominal aorta and visualized branches. No enlarged abdominal or  pelvic lymph nodes. Reproductive: Prostate is mildly enlarged nodular impressing upon the bladder neck. Testes appear incompletely descended, located within the inguinal canals bilaterally. Other: No abdominal wall hernia or abnormality. No abdominopelvic ascites. Musculoskeletal: Interval development of acute to subacute fractures at the posterolateral segments of the right ninth and tenth ribs. IMPRESSION: 1. Diffuse colonic dilatation with obstruction at the level of the sigmoid colon. There is mild adjacent fat stranding which could be secondary to ongoing diverticulitis, however a malignant obstruction at this site needs to be excluded by endoscopy. 2. Thickening of the bladder dome, greater to the left with adjacent fat stranding. There is a soft tissue tract extending from the sigmoid colon to the bladder, best seen on the coronal series. This is suspicious for early fistula development and cystitis. 3. Interval development of acute to subacute fractures at the posterolateral segments of the right ninth and tenth ribs. 4. 1.5 cm hyperenhancing lesion in the right hepatic lobe is not significantly changed since prior examination. The stability favors a benign etiology such as a hemangioma, however definitive diagnosis would require multiphasic CT or MRI. 5. Aortic atherosclerosis. Aortic Atherosclerosis  (ICD10-I70.0). Electronically Signed   By: Acquanetta Belling M.D.   On: 06/01/2023 20:25      Assessment/Plan LBO due to sigmoid stricture - afebrile, hemodynamically stable, WBC 10.2 - CT scan 06/01/23 shows dilated colon to the level of the sigmoid, also some bladder wall thickening and mention of possible CV fistula.  UA negative for infection. No dilation of the small bowel or stomach - sigmoidoscopy today 3/3 shows benign appearing sigmoid stricture that was unable to be traversed.   The operative and non-operative management of colonic stricture/LBO was discussed with the patient and his wife. Risks of surgery including bleeding, infection, damage to surrounding structures, drain placement, need for additional procedures, prolonged hospital stay, as well as the risks of general anesthesia were discussed with the patient and he would like to proceed with surgery. Questions were welcomed and answered.   We discussed plan for sigmoid colectomy, end colostomy tomorrow 3/4. We discussed expected post-operative course.    FEN - strict NPO, IVF per primary VTE - SCDs, chemical VTE ppx ok from surgical standpoint ID - none indicated, perioperative cefotetan  Admit - TRH service  HTN Anxiety BPH   I reviewed nursing notes, Consultant GI notes, hospitalist notes, last 24 h vitals and pain scores, last 48 h intake and output, last 24 h labs and trends, and last 24 h imaging results.  Adam Phenix, [facility] Surgery 06/03/2023, 2:43 PM Please see Amion for pager number during day hours 7:00am-4:30pm or 7:00am -11:30am on weekends

## 2023-06-03 NOTE — Interval H&P Note (Signed)
 History and Physical Interval Note:  06/03/2023 1:01 PM  Loma Sender  has presented today for surgery, with the diagnosis of Colonic obstruction; Abdominal pain; Change in bowel habits.  The various methods of treatment have been discussed with the patient and family. After consideration of risks, benefits and other options for treatment, the patient has consented to  Procedure(s): FLEXIBLE SIGMOIDOSCOPY (N/A) as a surgical intervention.  The patient's history has been reviewed, patient examined, no change in status, stable for surgery.  I have reviewed the patient's chart and labs.  Questions were answered to the patient's satisfaction.     Lynann Bologna

## 2023-06-03 NOTE — Progress Notes (Signed)
 PROGRESS NOTE    Wayne Lowe  ZOX:[id number] DOB: March 14, 1951 DOA: 06/01/2023 PCP: [facility] [place]   Brief Narrative:  Wayne Lowe is a 73 y.o. male with medical history significant of hypertension, hyperlipidemia, diverticulitis, anxiety, depression, BPH, GERD presented to the ED for evaluation of abdominal pain/distention, nausea, vomiting, and constipation that is been going on for about a week.  Upon arrival to ED, he was hemodynamically stable.  CT abdomen showed colonic dilatation with obstruction, possible diverticulitis with suspicion of mass/malignancy as well as possibility of colovesical fistula.  Patient admitted under hospitalist service, GI consulted, GI recommended holding off antibiotics.  Assessment & Plan:   Principal Problem:   Bowel obstruction (HCC) Active Problems:   Colovesical fistula   Rib fractures   Hypokalemia   QT prolongation   Essential hypertension  Sigmoid colon obstruction / Diverticulitis versus possible malignant obstruction CT showing diffuse colonic dilation with obstruction at the level of the sigmoid colon and there is mild adjacent fat stranding which could be secondary to ongoing diverticulitis, however, a malignant obstruction needs to be excluded by endoscopy.  Borderline leukocytosis on labs, no fever or signs of sepsis.  [facility]en by [facility], plan for flexible sigmoidoscopy today.  GI recommended holding off antibiotics for now.  He is n.p.o., will start him on gentle hydration.   Colovesical fistula CT showing a soft tissue tract extending from the sigmoid colon to the bladder suspicious for early fistula development and cystitis.  However, UA is not suggestive of infection.  Will hold off antibiotics at this time per GI recommendations.   Right 9th and 10th rib fractures Discussed with the patient and fractures appear to be subacute as he reports slipping on ice and falling 6 weeks ago.  Initially had pain at that  time but no longer having any rib pain or discomfort now.   Right hepatic lobe lesion CT showing a stable 1.5 cm hyperenhancing lesion in the right hepatic lobe favored to be a hemangioma per radiologist.  However, definitive diagnosis would require multiphasic CT or MRI.  GI consulted.   Mild hypokalemia QT prolongation QTc 521 on EKG.  Monitor potassium and magnesium levels, continue to replace as needed.  Avoid QT prolonging drugs and follow-up repeat EKG in the morning.   Mild metabolic acidosis Improving, will resume IV fluids.     Hypertension SBP currently in the 150s.  IV hydralazine PRN SBP >160.  DVT prophylaxis: SCDs Start: 06/01/23 2334   Code Status: Full Code  Family Communication:  None present at bedside.  Plan of care discussed with patient in length and he/she verbalized understanding and agreed with it.  Status is: Inpatient Remains inpatient appropriate because: Scheduled for sigmoidoscopy today.  Estimated body mass index is 28.8 kg/m as calculated from the following:   Height as of this encounter: 5\' 9"  (1.753 m).   Weight as of this encounter: 88.5 kg.    Nutritional Assessment: Body mass index is 28.8 kg/m.Marland Kitchen Seen by dietician.  I agree with the assessment and plan as outlined below: Nutrition Status:        . Skin Assessment: I have examined the patient's skin and I agree with the wound assessment as performed by the wound care RN as outlined below:    Consultants:  GI  Procedures:  None  Antimicrobials:  Anti-infectives (From admission, onward)    None         Subjective: Seen and examined.  No new  complaint.  Still with significantly distended abdomen and not passing flatus but does not complain of abdominal pain today.  Objective: Vitals:   06/02/23 0859 06/02/23 1512 06/02/23 2006 06/03/23 0541  BP: 134/76 (!) 151/72 (!) 182/73 (!) 172/72  Pulse: 73 64 65 68  Resp: 20 18 20 20   Temp: 98 F (36.7 C) 98 F (36.7 C) (!) 97.4  F (36.3 C) (!) 97.3 F (36.3 C)  TempSrc: Oral Oral Oral Oral  SpO2: 98% 98% 100% 98%  Weight:      Height:        Intake/Output Summary (Last 24 hours) at 06/03/2023 0738 Last data filed at 06/03/2023 0542 Gross per 24 hour  Intake 2022.72 ml  Output 1150 ml  Net 872.72 ml   Filed Weights   06/01/23 1333  Weight: 88.5 kg    Examination:  General exam: Appears calm and comfortable  Respiratory system: Clear to auscultation. Respiratory effort normal. Cardiovascular system: S1 & S2 heard, RRR. No JVD, murmurs, rubs, gallops or clicks. No pedal edema. Gastrointestinal system: Abdomen is soft but moderately distended and nontender. No organomegaly or masses felt. Normal bowel sounds heard. Central nervous system: Alert and oriented. No focal neurological deficits. Extremities: Symmetric 5 x 5 power. Skin: No rashes, lesions or ulcers Psychiatry: Judgement and insight appear normal. Mood & affect appropriate.    Data Reviewed: I have personally reviewed following labs and imaging studies  CBC: Recent Labs  Lab 06/01/23 1800 06/02/23 0623 06/03/23 0613  WBC 11.4* 10.9* 10.2  NEUTROABS 9.6*  --  8.4*  HGB 14.9 13.9 14.0  HCT 46.4 44.0 43.5  MCV 91.2 93.2 92.8  PLT 382 342 342   Basic Metabolic Panel: Recent Labs  Lab 06/01/23 1800 06/02/23 0623 06/03/23 0613  NA 137 140 139  K 3.2* 3.9 4.1  CL 103 110 108  CO2 19* 21* 22  GLUCOSE 133* 113* 111*  BUN 20 16 15   CREATININE 1.02 0.77 0.70  CALCIUM 9.3 8.8* 9.1  MG  --  2.4  --    GFR: Estimated Creatinine Clearance: 89.1 mL/min (by C-G formula based on SCr of 0.7 mg/dL). Liver Function Tests: Recent Labs  Lab 06/01/23 1800  AST 23  ALT 17  ALKPHOS 65  BILITOT 1.1  PROT 9.0*  ALBUMIN 3.8   Recent Labs  Lab 06/01/23 1800  LIPASE 25   No results for input(s): "AMMONIA" in the last 168 hours. Coagulation Profile: No results for input(s): "INR", "PROTIME" in the last 168 hours. Cardiac Enzymes: No  results for input(s): "CKTOTAL", "CKMB", "CKMBINDEX", "TROPONINI" in the last 168 hours. BNP (last 3 results) No results for input(s): "PROBNP" in the last 8760 hours. HbA1C: No results for input(s): "HGBA1C" in the last 72 hours. CBG: No results for input(s): "GLUCAP" in the last 168 hours. Lipid Profile: No results for input(s): "CHOL", "HDL", "LDLCALC", "TRIG", "CHOLHDL", "LDLDIRECT" in the last 72 hours. Thyroid Function Tests: No results for input(s): "TSH", "T4TOTAL", "FREET4", "T3FREE", "THYROIDAB" in the last 72 hours. Anemia Panel: No results for input(s): "VITAMINB12", "FOLATE", "FERRITIN", "TIBC", "IRON", "RETICCTPCT" in the last 72 hours. Sepsis Labs: No results for input(s): "PROCALCITON", "LATICACIDVEN" in the last 168 hours.  No results found for this or any previous visit (from the past 240 hours).   Radiology Studies: CT ABDOMEN PELVIS W CONTRAST Result Date: 06/01/2023 CLINICAL DATA:  Diverticulitis.  Bowel obstruction suspected. EXAM: CT ABDOMEN AND PELVIS WITH CONTRAST TECHNIQUE: Multidetector CT imaging of the abdomen and pelvis  was performed using the standard protocol following bolus administration of intravenous contrast. RADIATION DOSE REDUCTION: This exam was performed according to the departmental dose-optimization program which includes automated exposure control, adjustment of the mA and/or kV according to patient size and/or use of iterative reconstruction technique. CONTRAST:  OMNIPAQUE IOHEXOL 300 MG/ML  SOLN COMPARISON:  10/24/2022 FINDINGS: Lower chest: No acute abnormality. Hepatobiliary: 1.5 cm hyperenhancing lesion seen in the right hepatic lobe (image 19, series 4). It is not significantly changed since prior examination. The stability favors a benign etiology such as a hemangioma, however definitive diagnosis would require multiphasic CT or MRI. The liver, gallbladder, and bile ducts are otherwise unremarkable. Pancreas: Unremarkable. No pancreatic  ductal dilatation or surrounding inflammatory changes. Spleen: Normal in size without focal abnormality. Adrenals/Urinary Tract: Adrenal glands are normal. Numerous bilateral simple cysts do not require dedicated imaging surveillance. The largest is located in the lower pole the right kidney measuring 7.1 cm. There are also numerous subcentimeter renal hypodensities which are too small to fully characterize. These also likely represent simple cysts and do not require dedicated imaging follow-up. No hydronephrosis or hydroureter. There is thickening of the bladder dome, greater to the left with adjacent fat stranding. There is a soft tissue tract extending from the sigmoid colon to the bladder, best seen on the coronal series images 81-95. This is suspicious for early fistula development. Stomach/Bowel: Small hiatal hernia. Stomach and doudenum otherwise unremarkable. Appendix is normal. Diffuse colonic dilatation with obstruction at the level of the sigmoid colon, best seen on image 76 of series 4. There is mild adjacent fat stranding which could be secondary to ongoing diverticulitis, however a malignant obstruction at this site needs to be excluded by endoscopy. Vascular/Lymphatic: Calcified atheromatous plaque seen throughout the abdominal aorta and visualized branches. No enlarged abdominal or pelvic lymph nodes. Reproductive: Prostate is mildly enlarged nodular impressing upon the bladder neck. Testes appear incompletely descended, located within the inguinal canals bilaterally. Other: No abdominal wall hernia or abnormality. No abdominopelvic ascites. Musculoskeletal: Interval development of acute to subacute fractures at the posterolateral segments of the right ninth and tenth ribs. IMPRESSION: 1. Diffuse colonic dilatation with obstruction at the level of the sigmoid colon. There is mild adjacent fat stranding which could be secondary to ongoing diverticulitis, however a malignant obstruction at this site  needs to be excluded by endoscopy. 2. Thickening of the bladder dome, greater to the left with adjacent fat stranding. There is a soft tissue tract extending from the sigmoid colon to the bladder, best seen on the coronal series. This is suspicious for early fistula development and cystitis. 3. Interval development of acute to subacute fractures at the posterolateral segments of the right ninth and tenth ribs. 4. 1.5 cm hyperenhancing lesion in the right hepatic lobe is not significantly changed since prior examination. The stability favors a benign etiology such as a hemangioma, however definitive diagnosis would require multiphasic CT or MRI. 5. Aortic atherosclerosis. Aortic Atherosclerosis (ICD10-I70.0). Electronically Signed   By: Acquanetta Belling M.D.   On: 06/01/2023 20:25    Scheduled Meds: Continuous Infusions:  sodium chloride       LOS: 2 days   Wayne Closs, MD [facility]  06/03/2023, 7:38 AM   *Please note that this is a verbal dictation therefore any spelling or grammatical errors are due to the "Dragon [employer] One" system interpretation.  Please page via Amion and do not message via secure chat for urgent patient care matters. Secure chat can be  used for non urgent patient care matters.  How to contact the [facility] Attending or Consulting provider 7A - 7P or covering provider during after hours 7P -7A, for this patient?  Check the care team in [facility] and look for a) attending/consulting [facility] provider listed and b) the [facility] team listed. Page or secure chat 7A-7P. Log into [email or web address] and use [facility] universal password to access. If you do not have the password, please contact the hospital operator. Locate the [facility] provider you are looking for under [facility] and page to a number that you can be directly reached. If you still have difficulty reaching the provider, please page the [facility] (Director on Call) for the Hospitalists listed on amion for assistance.

## 2023-06-03 NOTE — Transfer of Care (Signed)
 Immediate Anesthesia Transfer of Care Note  Patient: Wayne Lowe  Procedure(s) Performed: FLEXIBLE SIGMOIDOSCOPY  Patient Location: PACU and Endoscopy Unit  Anesthesia Type:MAC  Level of Consciousness: drowsy  Airway & Oxygen Therapy: Patient Spontanous Breathing and Patient connected to nasal cannula oxygen  Post-op Assessment: Report given to RN and Post -op Vital signs reviewed and stable  Post vital signs: Reviewed and stable  Last Vitals:  Vitals Value Taken Time  BP    Temp    Pulse    Resp    SpO2      Last Pain:  Vitals:   06/03/23 1206  TempSrc: Tympanic  PainSc: 0-No pain      Patients Stated Pain Goal: 2 (06/02/23 2220)  Complications: No notable events documented.

## 2023-06-04 ENCOUNTER — Inpatient Hospital Stay (HOSPITAL_COMMUNITY)

## 2023-06-04 ENCOUNTER — Encounter (HOSPITAL_COMMUNITY)
Admission: EM | Disposition: A | Discharge status: Home Health | Payer: Self-pay | Source: Home / Self Care | Attending: Family Medicine

## 2023-06-04 ENCOUNTER — Inpatient Hospital Stay (HOSPITAL_COMMUNITY): Admitting: Anesthesiology

## 2023-06-04 ENCOUNTER — Other Ambulatory Visit: Payer: Self-pay

## 2023-06-04 ENCOUNTER — Encounter (HOSPITAL_COMMUNITY): Payer: Self-pay | Admitting: Internal Medicine

## 2023-06-04 DIAGNOSIS — E039 Hypothyroidism, unspecified: Secondary | ICD-10-CM | POA: Diagnosis not present

## 2023-06-04 DIAGNOSIS — Z87891 Personal history of nicotine dependence: Secondary | ICD-10-CM

## 2023-06-04 DIAGNOSIS — I10 Essential (primary) hypertension: Secondary | ICD-10-CM | POA: Diagnosis not present

## 2023-06-04 DIAGNOSIS — K56609 Unspecified intestinal obstruction, unspecified as to partial versus complete obstruction: Secondary | ICD-10-CM | POA: Diagnosis not present

## 2023-06-04 HISTORY — PX: PARTIAL COLECTOMY: SHX5273

## 2023-06-04 LAB — TYPE AND SCREEN
ABO/RH(D): O POS
Antibody Screen: NEGATIVE

## 2023-06-04 LAB — ABO/RH: ABO/RH(D): O POS

## 2023-06-04 SURGERY — COLECTOMY, PARTIAL
Anesthesia: General

## 2023-06-04 MED ORDER — ALBUMIN HUMAN 5 % IV SOLN
INTRAVENOUS | Status: AC
  Filled 2023-06-04: qty 250

## 2023-06-04 MED ORDER — MEPERIDINE HCL 50 MG/ML IJ SOLN
INTRAMUSCULAR | Status: AC
  Filled 2023-06-04: qty 1

## 2023-06-04 MED ORDER — PROPOFOL 10 MG/ML IV BOLUS
INTRAVENOUS | Status: DC | PRN
  Administered 2023-06-04: 130 mg via INTRAVENOUS

## 2023-06-04 MED ORDER — ACETAMINOPHEN 10 MG/ML IV SOLN
1000.0000 mg | Freq: Four times a day (QID) | INTRAVENOUS | Status: AC
  Administered 2023-06-04 – 2023-06-05 (×3): 1000 mg via INTRAVENOUS
  Filled 2023-06-04 (×3): qty 100

## 2023-06-04 MED ORDER — ONDANSETRON HCL 4 MG/2ML IJ SOLN
INTRAMUSCULAR | Status: DC | PRN
  Administered 2023-06-04: 4 mg via INTRAVENOUS

## 2023-06-04 MED ORDER — LACTATED RINGERS IV SOLN: INTRAVENOUS | Status: AC

## 2023-06-04 MED ORDER — FENTANYL CITRATE (PF) 250 MCG/5ML IJ SOLN
INTRAMUSCULAR | Status: DC | PRN
Start: 2023-06-04 — End: 2023-06-04
  Administered 2023-06-04 (×2): 50 ug via INTRAVENOUS
  Administered 2023-06-04: 100 ug via INTRAVENOUS
  Administered 2023-06-04: 50 ug via INTRAVENOUS

## 2023-06-04 MED ORDER — LIDOCAINE HCL (CARDIAC) PF 100 MG/5ML IV SOSY
PREFILLED_SYRINGE | INTRAVENOUS | Status: DC | PRN
Start: 2023-06-04 — End: 2023-06-04
  Administered 2023-06-04: 40 mg via INTRAVENOUS

## 2023-06-04 MED ORDER — ORAL CARE MOUTH RINSE: 15.0000 mL | Freq: Once | OROMUCOSAL | Status: AC

## 2023-06-04 MED ORDER — ONDANSETRON HCL 4 MG/2ML IJ SOLN
INTRAMUSCULAR | Status: AC
  Filled 2023-06-04: qty 2

## 2023-06-04 MED ORDER — FENTANYL CITRATE (PF) 250 MCG/5ML IJ SOLN
INTRAMUSCULAR | Status: AC
  Filled 2023-06-04: qty 5

## 2023-06-04 MED ORDER — ROCURONIUM BROMIDE 10 MG/ML (PF) SYRINGE
PREFILLED_SYRINGE | INTRAVENOUS | Status: DC | PRN
  Administered 2023-06-04: 20 mg via INTRAVENOUS
  Administered 2023-06-04: 50 mg via INTRAVENOUS

## 2023-06-04 MED ORDER — HYDROMORPHONE HCL 1 MG/ML IJ SOLN
0.2500 mg | INTRAMUSCULAR | Status: DC | PRN
  Administered 2023-06-04: 0.25 mg via INTRAVENOUS

## 2023-06-04 MED ORDER — STERILE WATER FOR IRRIGATION IR SOLN
Status: DC | PRN
  Administered 2023-06-04: 1000 mL

## 2023-06-04 MED ORDER — SUCCINYLCHOLINE CHLORIDE 200 MG/10ML IV SOSY
PREFILLED_SYRINGE | INTRAVENOUS | Status: DC | PRN
  Administered 2023-06-04: 120 mg via INTRAVENOUS

## 2023-06-04 MED ORDER — 0.9 % SODIUM CHLORIDE (POUR BTL) OPTIME
TOPICAL | Status: DC | PRN
  Administered 2023-06-04 (×2): 1000 mL

## 2023-06-04 MED ORDER — ACETAMINOPHEN 10 MG/ML IV SOLN: 1000.0000 mg | Freq: Once | INTRAVENOUS | Status: DC | PRN

## 2023-06-04 MED ORDER — ACETAMINOPHEN 160 MG/5ML PO SOLN: 325.0000 mg | Freq: Once | ORAL | Status: DC | PRN

## 2023-06-04 MED ORDER — METHYLENE BLUE (ANTIDOTE) 1 % IV SOLN
INTRAVENOUS | Status: AC
  Filled 2023-06-04: qty 10

## 2023-06-04 MED ORDER — LACTATED RINGERS IV SOLN: INTRAVENOUS | Status: DC

## 2023-06-04 MED ORDER — BOOST / RESOURCE BREEZE PO LIQD CUSTOM
1.0000 | Freq: Three times a day (TID) | ORAL | Status: DC
Start: 2023-06-05 — End: 2023-06-10
  Administered 2023-06-05 – 2023-06-10 (×4): 1 via ORAL

## 2023-06-04 MED ORDER — KETOROLAC TROMETHAMINE 30 MG/ML IJ SOLN
INTRAMUSCULAR | Status: DC | PRN
  Administered 2023-06-04: 15 mg via INTRAVENOUS

## 2023-06-04 MED ORDER — DEXAMETHASONE SODIUM PHOSPHATE 10 MG/ML IJ SOLN
INTRAMUSCULAR | Status: AC
  Filled 2023-06-04: qty 1

## 2023-06-04 MED ORDER — SUCCINYLCHOLINE CHLORIDE 200 MG/10ML IV SOSY
PREFILLED_SYRINGE | INTRAVENOUS | Status: AC
  Filled 2023-06-04: qty 10

## 2023-06-04 MED ORDER — PHENYLEPHRINE 80 MCG/ML (10ML) SYRINGE FOR IV PUSH (FOR BLOOD PRESSURE SUPPORT)
PREFILLED_SYRINGE | INTRAVENOUS | Status: DC | PRN
  Administered 2023-06-04 (×2): 160 ug via INTRAVENOUS

## 2023-06-04 MED ORDER — ALBUMIN HUMAN 5 % IV SOLN: INTRAVENOUS | Status: DC | PRN

## 2023-06-04 MED ORDER — LIDOCAINE HCL (PF) 2 % IJ SOLN
INTRAMUSCULAR | Status: AC
  Filled 2023-06-04: qty 5

## 2023-06-04 MED ORDER — SODIUM CHLORIDE 0.9 % IV SOLN
INTRAVENOUS | Status: AC
  Filled 2023-06-04: qty 2

## 2023-06-04 MED ORDER — SODIUM CHLORIDE 0.9 % IV SOLN
2.0000 g | INTRAVENOUS | Status: AC
  Administered 2023-06-04: 2 g via INTRAVENOUS

## 2023-06-04 MED ORDER — OXYCODONE HCL 5 MG PO TABS
5.0000 mg | ORAL_TABLET | ORAL | Status: DC | PRN
  Administered 2023-06-04 – 2023-06-09 (×4): 10 mg via ORAL
  Filled 2023-06-04: qty 2
  Filled 2023-06-04: qty 1
  Filled 2023-06-04 (×2): qty 2
  Filled 2023-06-04: qty 1

## 2023-06-04 MED ORDER — EPHEDRINE 5 MG/ML INJ
INTRAVENOUS | Status: AC
  Filled 2023-06-04: qty 5

## 2023-06-04 MED ORDER — EPHEDRINE SULFATE-NACL 50-0.9 MG/10ML-% IV SOSY
PREFILLED_SYRINGE | INTRAVENOUS | Status: DC | PRN
  Administered 2023-06-04: 15 mg via INTRAVENOUS
  Administered 2023-06-04: 10 mg via INTRAVENOUS

## 2023-06-04 MED ORDER — CHLORHEXIDINE GLUCONATE 0.12 % MT SOLN
15.0000 mL | Freq: Once | OROMUCOSAL | Status: AC
  Administered 2023-06-04: 15 mL via OROMUCOSAL

## 2023-06-04 MED ORDER — SUGAMMADEX SODIUM 200 MG/2ML IV SOLN
INTRAVENOUS | Status: AC
  Filled 2023-06-04: qty 2

## 2023-06-04 MED ORDER — METHOCARBAMOL 1000 MG/10ML IJ SOLN
500.0000 mg | Freq: Three times a day (TID) | INTRAMUSCULAR | Status: DC
  Administered 2023-06-04 – 2023-06-05 (×2): 500 mg via INTRAVENOUS
  Filled 2023-06-04 (×2): qty 10

## 2023-06-04 MED ORDER — ACETAMINOPHEN 325 MG PO TABS: 325.0000 mg | ORAL_TABLET | Freq: Once | ORAL | Status: DC | PRN

## 2023-06-04 MED ORDER — SUGAMMADEX SODIUM 200 MG/2ML IV SOLN
INTRAVENOUS | Status: DC | PRN
  Administered 2023-06-04: 200 mg via INTRAVENOUS

## 2023-06-04 MED ORDER — MEPERIDINE HCL 50 MG/ML IJ SOLN
6.2500 mg | INTRAMUSCULAR | Status: DC | PRN
  Administered 2023-06-04: 12.5 mg via INTRAVENOUS

## 2023-06-04 MED ORDER — PROPOFOL 10 MG/ML IV BOLUS
INTRAVENOUS | Status: AC
  Filled 2023-06-04: qty 20

## 2023-06-04 MED ORDER — DEXAMETHASONE SODIUM PHOSPHATE 10 MG/ML IJ SOLN
INTRAMUSCULAR | Status: DC | PRN
  Administered 2023-06-04: 5 mg via INTRAVENOUS

## 2023-06-04 MED ORDER — ROCURONIUM BROMIDE 10 MG/ML (PF) SYRINGE
PREFILLED_SYRINGE | INTRAVENOUS | Status: AC
  Filled 2023-06-04: qty 10

## 2023-06-04 MED ORDER — PHENYLEPHRINE 80 MCG/ML (10ML) SYRINGE FOR IV PUSH (FOR BLOOD PRESSURE SUPPORT)
PREFILLED_SYRINGE | INTRAVENOUS | Status: AC
  Filled 2023-06-04: qty 10

## 2023-06-04 MED ORDER — HYDROMORPHONE HCL 1 MG/ML IJ SOLN
INTRAMUSCULAR | Status: AC
  Administered 2023-06-04: 0.5 mg via INTRAVENOUS
  Filled 2023-06-04: qty 1

## 2023-06-04 SURGICAL SUPPLY — 64 items
APPLICATOR COTTON TIP 6 STRL (MISCELLANEOUS) ×2 IMPLANT
APPLICATOR COTTON TIP 6IN STRL (MISCELLANEOUS) ×2 IMPLANT
BAG COUNTER SPONGE SURGICOUNT (BAG) IMPLANT
BLADE EXTENDED COATED 6.5IN (ELECTRODE) IMPLANT
BLADE HEX COATED 2.75 (ELECTRODE) ×1 IMPLANT
BLADE SURG SZ10 CARB STEEL (BLADE) IMPLANT
CLIP TI LARGE 6 (CLIP) IMPLANT
COVER MAYO STAND STRL (DRAPES) ×1 IMPLANT
DRAIN CHANNEL 10F 3/8 F FF (DRAIN) IMPLANT
DRAPE LAPAROSCOPIC ABDOMINAL (DRAPES) ×1 IMPLANT
DRAPE SHEET LG 3/4 BI-LAMINATE (DRAPES) IMPLANT
DRAPE WARM FLUID 44X44 (DRAPES) ×1 IMPLANT
DRSG OPSITE POSTOP 4X10 (GAUZE/BANDAGES/DRESSINGS) IMPLANT
ELECT REM PT RETURN 15FT ADLT (MISCELLANEOUS) ×1 IMPLANT
EVACUATOR DRAINAGE 10X20 100CC (DRAIN) IMPLANT
EVACUATOR SILICONE 100CC (DRAIN) IMPLANT
GAUZE 4X4 16PLY ~~LOC~~+RFID DBL (SPONGE) IMPLANT
GAUZE PAD ABD 8X10 STRL (GAUZE/BANDAGES/DRESSINGS) IMPLANT
GAUZE SPONGE 4X4 12PLY STRL (GAUZE/BANDAGES/DRESSINGS) ×1 IMPLANT
GLOVE BIO SURGEON STRL SZ7.5 (GLOVE) ×1 IMPLANT
GLOVE BIOGEL PI IND STRL 7.0 (GLOVE) ×1 IMPLANT
GLOVE INDICATOR 8.0 STRL GRN (GLOVE) ×2 IMPLANT
GOWN STRL REUS W/ TWL XL LVL3 (GOWN DISPOSABLE) ×2 IMPLANT
HANDLE SUCTION POOLE (INSTRUMENTS) ×1 IMPLANT
KIT BASIN OR (CUSTOM PROCEDURE TRAY) ×1 IMPLANT
KIT TURNOVER KIT A (KITS) IMPLANT
LEGGING LITHOTOMY PAIR STRL (DRAPES) IMPLANT
LIGASURE IMPACT 36 18CM CVD LR (INSTRUMENTS) IMPLANT
NS IRRIG 1000ML POUR BTL (IV SOLUTION) ×2 IMPLANT
PACK GENERAL/GYN (CUSTOM PROCEDURE TRAY) ×1 IMPLANT
PAD TELFA 2X3 NADH STRL (GAUZE/BANDAGES/DRESSINGS) IMPLANT
RELOAD GRN CONTOUR (ENDOMECHANICALS) ×2 IMPLANT
RELOAD PROXIMATE 75MM BLUE (ENDOMECHANICALS) ×1 IMPLANT
RELOAD STAPLE 40 GRN THCK (ENDOMECHANICALS) IMPLANT
RELOAD STAPLE 75 3.8 BLU REG (ENDOMECHANICALS) IMPLANT
RETRACTOR WOUND ALXS 34CM XLRG (MISCELLANEOUS) IMPLANT
RTRCTR WOUND ALEXIS 34CM XLRG (MISCELLANEOUS) ×1 IMPLANT
SHEARS HARMONIC 36 ACE (MISCELLANEOUS) IMPLANT
STAPLER CVD CUT BL 40 RELOAD (ENDOMECHANICALS) ×1 IMPLANT
STAPLER CVD CUT BLU 40 RELOAD (ENDOMECHANICALS) IMPLANT
STAPLER PROXIMATE 75MM BLUE (STAPLE) IMPLANT
STAPLER SKIN PROX WIDE 3.9 (STAPLE) ×1 IMPLANT
SUCTION POOLE HANDLE (INSTRUMENTS) ×1 IMPLANT
SUT CHROMIC 0 SH (SUTURE) IMPLANT
SUT NOV 1 T60/GS (SUTURE) IMPLANT
SUT NOVA NAB DX-16 0-1 5-0 T12 (SUTURE) IMPLANT
SUT NOVA T20/GS 25 (SUTURE) IMPLANT
SUT PDS AB 1 TP1 96 (SUTURE) IMPLANT
SUT PROLENE 2 0 SH DA (SUTURE) IMPLANT
SUT SILK 2 0 SH CR/8 (SUTURE) IMPLANT
SUT SILK 2 0SH CR/8 30 (SUTURE) IMPLANT
SUT SILK 2-0 18XBRD TIE 12 (SUTURE) IMPLANT
SUT SILK 2-0 30XBRD TIE 12 (SUTURE) IMPLANT
SUT SILK 3 0 SH CR/8 (SUTURE) IMPLANT
SUT SILK 3-0 18XBRD TIE 12 (SUTURE) IMPLANT
SUT VIC AB 2-0 SH 18 (SUTURE) IMPLANT
SUT VIC AB 2-0 SH 27XBRD (SUTURE) IMPLANT
SUT VIC AB 3-0 SH 18 (SUTURE) IMPLANT
SUT VICRYL 3-0 CR8 SH (SUTURE) IMPLANT
SUT VICRYL AB 2 0 TIES (SUTURE) ×2 IMPLANT
TOWEL OR 17X26 10 PK STRL BLUE (TOWEL DISPOSABLE) ×2 IMPLANT
TRAY FOLEY MTR SLVR 14FR STAT (SET/KITS/TRAYS/PACK) IMPLANT
TRAY FOLEY MTR SLVR 16FR STAT (SET/KITS/TRAYS/PACK) IMPLANT
YANKAUER SUCT BULB TIP NO VENT (SUCTIONS) ×1 IMPLANT

## 2023-06-04 NOTE — Anesthesia Procedure Notes (Signed)
 Procedure Name: Intubation Date/Time: 06/04/2023 9:50 AM  Performed by: Elyn Peers, CRNAPre-anesthesia Checklist: Patient identified, Emergency Drugs available, Suction available, Patient being monitored and Timeout performed Patient Re-evaluated:Patient Re-evaluated prior to induction Oxygen Delivery Method: Circle system utilized Preoxygenation: Pre-oxygenation with 100% oxygen Induction Type: IV induction, Rapid sequence and Cricoid Pressure applied Laryngoscope Size: Miller and 3 Grade View: Grade I Tube type: Oral Tube size: 7.5 mm Number of attempts: 1 Airway Equipment and Method: Stylet Placement Confirmation: ETT inserted through vocal cords under direct vision, positive ETCO2 and breath sounds checked- equal and bilateral Secured at: 24 cm Tube secured with: Tape Dental Injury: Teeth and Oropharynx as per pre-operative assessment

## 2023-06-04 NOTE — Consult Note (Signed)
 WOC consulted for post op ostomy care and teaching. He has been placed on our FU list, will check in on patient, wife and I agreed on 11am time tomorrow 3/5.  Jagjit Riner [facility], CNS, [employer] [phone]

## 2023-06-04 NOTE — Plan of Care (Signed)

## 2023-06-04 NOTE — Anesthesia Postprocedure Evaluation (Signed)
 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  Last Vitals:  Vitals:   06/04/23 1345 06/04/23 1400  BP: (!) 159/76 (!) 166/83  Pulse: 72   Resp: 20 20  Temp:    SpO2: 97% 95%    Last Pain:  Vitals:   06/04/23 1400  TempSrc:   PainSc: 3                  Shelton Silvas

## 2023-06-04 NOTE — Progress Notes (Signed)
 Late Entry. 3:04 PM - Pt post-op dressing soiled with stool from colostomy. PA notified. Per PA, remove the honeycomb and cover with an abd pad and tape, either reinforce or change the ostomy appliance. Dressing and ostomy appliance changed per order.  5:45 PM - pt pulled NGT. Pt tolerating clears. No nausea or vomiting. PA notified. Per PA, okay to leave out, if he vomits please contact the on call doctor for general surgery about possible replacement.

## 2023-06-04 NOTE — Anesthesia Preprocedure Evaluation (Addendum)
 Anesthesia Evaluation  Patient identified by MRN, date of birth, ID band Patient awake    Reviewed: Allergy & Precautions, NPO status , Patient's Chart, lab work & pertinent test results  Airway Mallampati: I  TM Distance: <3 FB Neck ROM: Full    Dental  (+) Edentulous Upper, Dental Advisory Given   Pulmonary former smoker   breath sounds clear to auscultation       Cardiovascular hypertension, Pt. on medications  Rhythm:Regular Rate:Normal     Neuro/Psych  PSYCHIATRIC DISORDERS Anxiety Depression       GI/Hepatic negative GI ROS, Neg liver ROS,,,  Endo/Other  Hypothyroidism    Renal/GU negative Renal ROS     Musculoskeletal negative musculoskeletal ROS (+)    Abdominal   Peds  Hematology negative hematology ROS (+)   Anesthesia Other Findings   Reproductive/Obstetrics                             Anesthesia Physical Anesthesia Plan  ASA: 3  Anesthesia Plan: General   Post-op Pain Management: Tylenol PO (pre-op)* and Toradol IV (intra-op)*   Induction: Intravenous and Rapid sequence  PONV Risk Score and Plan: 3 and Ondansetron, Dexamethasone and Treatment may vary due to age or medical condition  Airway Management Planned: Oral ETT  Additional Equipment: None  Intra-op Plan:   Post-operative Plan: Extubation in OR  Informed Consent: I have reviewed the patients History and Physical, chart, labs and discussed the procedure including the risks, benefits and alternatives for the proposed anesthesia with the patient or authorized representative who has indicated his/her understanding and acceptance.     Dental advisory given  Plan Discussed with: CRNA  Anesthesia Plan Comments:        Anesthesia Quick Evaluation

## 2023-06-04 NOTE — Consult Note (Signed)
 WOC Nurse requested for preoperative stoma site marking  Discussed surgical procedure and stoma creation with patient and family.  Explained role of the WOC nurse team.  Provided the patient with educational booklet and provided samples of pouching options.  Answered patient and family questions.   Examined patient lying and sitting up, patient is in SS preparing to go to the OR. in order to place the marking in the patient's visual field, away from any creases or abdominal contour issues and within the rectus muscle.   Marked for colostomy in the LLQ  5____ cm to the left of the umbilicus and __1__cm below the umbilicus.  Dr. Cliffton Asters at bedside does not wish to have marked for both colostomy and ileostomy   Covered mark with thin film transparent dressing, patient has had CHG bath prior to my arrival.   Scheduled teaching with wife per her request for 11am tomorrow.   WOC Nurse team will follow up with patient after surgery for continue ostomy care and teaching.  Sung Parodi [facility] MSN, RN,CWOCN, CNS, [employer] [phone]

## 2023-06-04 NOTE — Progress Notes (Signed)
 PROGRESS NOTE    Wayne Lowe  YNW:[id number] DOB: 09-01-1950 DOA: 06/01/2023 PCP: [facility] [place]   Brief Narrative:  Wayne Lowe is a 73 y.o. male with medical history significant of hypertension, hyperlipidemia, diverticulitis, anxiety, depression, BPH, GERD presented to the ED for evaluation of abdominal pain/distention, nausea, vomiting, and constipation that is been going on for about a week.  Upon arrival to ED, he was hemodynamically stable.  CT abdomen showed colonic dilatation with obstruction, possible diverticulitis with suspicion of mass/malignancy as well as possibility of colovesical fistula.  Patient admitted under hospitalist service, GI consulted, GI recommended holding off antibiotics.  Assessment & Plan:   Principal Problem:   Bowel obstruction (HCC) Active Problems:   Colovesical fistula   Rib fractures   Hypokalemia   QT prolongation   Essential hypertension  Sigmoid colon obstruction / Diverticulitis versus possible malignant obstruction CT showing diffuse colonic dilation with obstruction at the level of the sigmoid colon and there is mild adjacent fat stranding which could be secondary to ongoing diverticulitis, however, a malignant obstruction needs to be excluded by endoscopy.  Borderline leukocytosis on labs, no fever or signs of sepsis.  Seen by [facility], underwent flexible sigmoidoscopy 06/03/23 which demonstrated a intrinsic stricture of the sigmoid colon that was unable to be traversed. There is no evident mass within his colon.  Per GI recommendations, general surgery consulted and plan is for surgical procedure today.   Colovesical fistula CT showing a soft tissue tract extending from the sigmoid colon to the bladder suspicious for early fistula development and cystitis.  However, UA is not suggestive of infection.  Will hold off antibiotics at this time per GI recommendations.   Right 9th and 10th rib fractures Discussed with  the patient and fractures appear to be subacute as he reports slipping on ice and falling 6 weeks ago.  Initially had pain at that time but no longer having any rib pain or discomfort now.   Right hepatic lobe lesion CT showing a stable 1.5 cm hyperenhancing lesion in the right hepatic lobe favored to be a hemangioma per radiologist.  However, definitive diagnosis would require multiphasic CT or MRI.  GI consulted.   Mild hypokalemia QT prolongation QTc 521 on EKG.  Monitor potassium and magnesium levels, continue to replace as needed.  Avoid QT prolonging drugs and follow-up repeat EKG in the morning.   Mild metabolic acidosis Resolved with IV fluids.   Hypertension SBP currently in the 150s.  IV hydralazine PRN SBP >160.  DVT prophylaxis: SCDs Start: 06/01/23 2334   Code Status: Full Code  Family Communication: Wife present at bedside.  Plan of care discussed with patient in length and he/she verbalized understanding and agreed with it.  Status is: Inpatient Remains inpatient appropriate because: Scheduled for surgical procedure today.  Estimated body mass index is 28.81 kg/m as calculated from the following:   Height as of this encounter: 5\' 9"  (1.753 m).   Weight as of this encounter: 88.5 kg.    Nutritional Assessment: Body mass index is 28.81 kg/m.Marland Kitchen Seen by dietician.  I agree with the assessment and plan as outlined below: Nutrition Status:        . Skin Assessment: I have examined the patient's skin and I agree with the wound assessment as performed by the wound care RN as outlined below:    Consultants:  GI  Procedures:  None  Antimicrobials:  Anti-infectives (From admission, onward)    None  Subjective: Patient seen and examined.  Wife at the bedside.  He says that his pain is better controlled since dosage of morphine was increased yesterday.  He is still not passing gas.  Abdomen is as distended as it was yesterday but slightly softer  today.  Objective: Vitals:   06/03/23 1345 06/03/23 1417 06/03/23 1921 06/04/23 0525  BP: (!) 169/100 (!) 160/91 (!) 167/94 (!) 156/92  Pulse: 74 66 75 74  Resp: 12  17 17   Temp:  97.9 F (36.6 C) 97.8 F (36.6 C) 98 F (36.7 C)  TempSrc:   Oral Oral  SpO2: 100% 99% 99% 97%  Weight:      Height:        Intake/Output Summary (Last 24 hours) at 06/04/2023 0753 Last data filed at 06/04/2023 0981 Gross per 24 hour  Intake 461.17 ml  Output 450 ml  Net 11.17 ml   Filed Weights   06/01/23 1333 06/03/23 1206  Weight: 88.5 kg 88.5 kg    Examination:  General exam: Appears calm and comfortable  Respiratory system: Clear to auscultation. Respiratory effort normal. Cardiovascular system: S1 & S2 heard, RRR. No JVD, murmurs, rubs, gallops or clicks. No pedal edema. Gastrointestinal system: Abdomen is soft, severely distended but nontender.  No organomegaly or masses felt. Normal bowel sounds heard. Central nervous system: Alert and oriented. No focal neurological deficits. Extremities: Symmetric 5 x 5 power. Skin: No rashes, lesions or ulcers.  Psychiatry: Judgement and insight appear normal. Mood & affect appropriate.   Data Reviewed: I have personally reviewed following labs and imaging studies  CBC: Recent Labs  Lab 06/01/23 1800 06/02/23 0623 06/03/23 0613  WBC 11.4* 10.9* 10.2  NEUTROABS 9.6*  --  8.4*  HGB 14.9 13.9 14.0  HCT 46.4 44.0 43.5  MCV 91.2 93.2 92.8  PLT 382 342 342   Basic Metabolic Panel: Recent Labs  Lab 06/01/23 1800 06/02/23 0623 06/03/23 0613  NA 137 140 139  K 3.2* 3.9 4.1  CL 103 110 108  CO2 19* 21* 22  GLUCOSE 133* 113* 111*  BUN 20 16 15   CREATININE 1.02 0.77 0.70  CALCIUM 9.3 8.8* 9.1  MG  --  2.4  --    GFR: Estimated Creatinine Clearance: 89.1 mL/min (by C-G formula based on SCr of 0.7 mg/dL). Liver Function Tests: Recent Labs  Lab 06/01/23 1800  AST 23  ALT 17  ALKPHOS 65  BILITOT 1.1  PROT 9.0*  ALBUMIN 3.8   Recent  Labs  Lab 06/01/23 1800  LIPASE 25   No results for input(s): "AMMONIA" in the last 168 hours. Coagulation Profile: No results for input(s): "INR", "PROTIME" in the last 168 hours. Cardiac Enzymes: No results for input(s): "CKTOTAL", "CKMB", "CKMBINDEX", "TROPONINI" in the last 168 hours. BNP (last 3 results) No results for input(s): "PROBNP" in the last 8760 hours. HbA1C: No results for input(s): "HGBA1C" in the last 72 hours. CBG: No results for input(s): "GLUCAP" in the last 168 hours. Lipid Profile: No results for input(s): "CHOL", "HDL", "LDLCALC", "TRIG", "CHOLHDL", "LDLDIRECT" in the last 72 hours. Thyroid Function Tests: No results for input(s): "TSH", "T4TOTAL", "FREET4", "T3FREE", "THYROIDAB" in the last 72 hours. Anemia Panel: No results for input(s): "VITAMINB12", "FOLATE", "FERRITIN", "TIBC", "IRON", "RETICCTPCT" in the last 72 hours. Sepsis Labs: No results for input(s): "PROCALCITON", "LATICACIDVEN" in the last 168 hours.  No results found for this or any previous visit (from the past 240 hours).   Radiology Studies: No results found.  Scheduled Meds: Continuous Infusions:  acetaminophen 1,000 mg (06/04/23 1610)     LOS: 3 days   Hughie Closs, MD [facility]  06/04/2023, 7:53 AM   *Please note that this is a verbal dictation therefore any spelling or grammatical errors are due to the "Dragon Medical One" system interpretation.  Please page via Amion and do not message via secure chat for urgent patient care matters. Secure chat can be used for non urgent patient care matters.  How to contact the [facility] Attending or Consulting provider 7A - 7P or covering provider during after hours 7P -7A, for this patient?  Check the care team in [facility] and look for a) attending/consulting TR[facility] provider listed and b) the [facility] team listed. Page or secure chat 7A-7P. Log into [email or web address] and use [facility]'s universal password to access. If you do not have the password,  please contact the hospital operator. Locate the [facility] provider you are looking for under [facility] and page to a number that you can be directly reached. If you still have difficulty reaching the provider, please page the [facility] (Director on Call) for the Hospitalists listed on amion for assistance.

## 2023-06-04 NOTE — Op Note (Signed)
 06/04/2023  12:09 PM  PATIENT:  Wayne Lowe  73 y.o. male  Patient Care Team: [facility] [place] as PCP - General (Family Medicine)  PRE-OPERATIVE DIAGNOSIS: Diverticular stricture; large bowel obstruction  POST-OPERATIVE DIAGNOSIS: Same  PROCEDURE: Exploratory laparotomy with sigmoid colectomy and end colostomy (Hartmann's procedure)  SURGEON:  Stephanie Coup. Clemmie Marxen, MD  ANESTHESIA:   general  COUNTS:  Sponge, needle and instrument counts were reported correct x2 at the conclusion of the operation.  EBL: 100 mL  DRAINS: None  SPECIMEN: Rectosigmoid colon - suture on proximal staple line  COMPLICATIONS: none  FINDINGS: Chronically inflamed appearing/fibrotic stricturing of the mid to distal sigmoid colon with associated proximal colonic dilation.  There is also a fair amount of gas in the rectum.  1.2 L liquid stool evacuated from the colon proximal to the stricture.  DISPOSITION: PACU in satisfactory condition  DESCRIPTION: The patient was identified in preop holding and taken to the OR where he was placed on the operating room table. SCDs were placed. General endotracheal anesthesia was induced without difficulty.  Pressure points were then padded.  A Foley catheter was placed by nursing.  He was then prepped and draped in the usual sterile fashion. A surgical timeout was performed indicating the correct patient, procedure, positioning and need for preoperative antibiotics.   A low midline incision was then created.  The subtenons tissues by electrocautery.  The fascia was incised the midline.  An Alexis wound protector was placed.  A Bookwalter retractor was also utilized.  The abdomen is evaluated.  There is mild to moderate dilation of the small bowel.  There is moderate dilation of the colon.  The cecum is healthy in appearance and viable.  The sigmoid is evaluated.  There is a fibrotic dense type stricture in the left lower quadrant at the mid to distal  sigmoid colon.  The colon was fairly dilated.  We opted to go just proximal to the stricture and create a small colotomy.  A suction irrigation device was then utilized to completely decompress as much of the colon as we could.  We evacuated approximately 1.2 L of thin gray stool.  A pursestring stitch had been placed at this location was subsequently tied.   The sigmoid is reevaluated.  We were able to carefully dissect this free from the left lower quadrant and then gently bluntly elevate this portion of the colon up off of the underlying retroperitoneum.  There is no evident fistula to the bladder.  There was no significant bleeding or any evident injury during this process.  The retroperitoneum was inspected.  The gonadal's appear intact.  The left ureter is underneath a veil of peritoneum.  We are able to deliver the strictured segment up.  The peritoneum on both sides at the level of the proximal rectum was incised.  The planned point of distal transection is identified at a location of the tinea have already splayed and there are loss of appendices epiploica.  Anatomically, this corresponds to the proximal rectum.  This overlies the sacral promontory.  A wound is created a mesorectum at this location.  A contoured green load stapler was then utilized to divide this.  The staple line is inspected and intact with well-formed staples.  The intervening mesentery is then ligated and divided using the hand-held LigaSure device taking this right along the colon in an effort to stay well above the underlying retroperitoneum.  We went proximal to the level of the  stricture.  A additional balloon on the contour stapler was then utilized to divide the colon proximal to the stricture.  The staple line was then marked with a stitch orienting it proximal.  This was then passed off.  The sigmoid and descending colon were then mobilized by incising the Taniya Dasher line of Toldt.  The associate mesocolon was reflected medially as  well.  We had more than adequate reach of this to reach of the left abdominal wall without tension.  The abdomen is then irrigated.  Hemostasis is verified.  Attention is then directed at the creation of the colostomy.  The left lower quadrant previously marked colostomy site is identified.  A wheal of skin is excised.  The underlying subcutaneous tissue was divided and excised.  The fascia was then incised longitudinally and the rectus muscle was spread.  This was dilated to approximately 2 fingerbreadths.  We are able to bring the colon out through this without any significant tension.  It is pink in color.  The abdomen is reevaluated.  The colostomy was inspected and found not to have any sort of twists.  An NG tube had been placed by anesthesia.  The omentum was brought down to the midline.  The fascia is then closed using 2 running #1 looped PDS sutures.  The fascial closure was palpated noted to be complete without any sort of separation.  The wound was irrigated.  All sponge, needle, and instrument counts are reported correct.  The skin is then approximated using skin staples.  A sterile honeycomb was placed over this.  Attention is directed to maturing the colostomy.  The staple line is then excised.  The colostomy was then matured using multiple interrupted 3-0 Vicryl sutures.  It is pink in color.  It is widely patent.  All final counts are also reported correct.  An ostomy appliance is cut to fit.  He is then awakened from anesthesia, extubated, and transferred to a stretcher for transport to recovery in satisfactory condition.

## 2023-06-04 NOTE — Transfer of Care (Signed)
 Immediate Anesthesia Transfer of Care Note  Patient: Wayne Lowe  Procedure(s) Performed: OPEN SIGMOID COLECTOMY WITH COLOSTOMY  Patient Location: PACU  Anesthesia Type:General  Level of Consciousness: awake, drowsy, and responds to stimulation  Airway & Oxygen Therapy: Patient Spontanous Breathing and Patient connected to face mask oxygen  Post-op Assessment: Report given to RN, Post -op Vital signs reviewed and stable, and reported that NG tube position not confirmed by surgeon.    Post vital signs: Reviewed and stable  Last Vitals:  Vitals Value Taken Time  BP 164/79 06/04/23 1219  Temp    Pulse 67 06/04/23 1222  Resp 21 06/04/23 1222  SpO2 99 % 06/04/23 1222  Vitals shown include unfiled device data.  Last Pain:  Vitals:   06/04/23 0822  TempSrc:   PainSc: 0-No pain      Patients Stated Pain Goal: 4 (06/04/23 1610)  Complications: No notable events documented.

## 2023-06-04 NOTE — Progress Notes (Signed)
  Subjective No acute events. No change in abdominal distention. Denies flatus/bm.   Objective: Vital signs in last 24 hours: Temp:  [97 F (36.1 C)-98 F (36.7 C)] 98 F (36.7 C) (03/04 0525) Pulse Rate:  [66-75] 74 (03/04 0525) Resp:  [12-27] 17 (03/04 0525) BP: (105-199)/(69-103) 156/92 (03/04 0525) SpO2:  [97 %-100 %] 97 % (03/04 0525) Weight:  [88.5 kg] 88.5 kg (03/03 1206) Last BM Date : 06/02/23  Intake/Output from previous day: 03/03 0701 - 03/04 0700 In: 461.2 [I.V.:200; IV Piggyback:261.2] Out: 450 [Urine:450] Intake/Output this shift: No intake/output data recorded.  Gen: NAD, comfortable CV: RRR Pulm: Normal work of breathing Abd: Soft, moderately distended, not significantly tender per se. Being marked for ostomy Ext: SCDs in place  Lab Results: CBC  Recent Labs    06/02/23 0623 06/03/23 0613  WBC 10.9* 10.2  HGB 13.9 14.0  HCT 44.0 43.5  PLT 342 342   BMET Recent Labs    06/02/23 0623 06/03/23 0613  NA 140 139  K 3.9 4.1  CL 110 108  CO2 21* 22  GLUCOSE 113* 111*  BUN 16 15  CREATININE 0.77 0.70  CALCIUM 8.8* 9.1   PT/INR No results for input(s): "LABPROT", "INR" in the last 72 hours. ABG No results for input(s): "PHART", "HCO3" in the last 72 hours.  Invalid input(s): "PCO2", "PO2"  Studies/Results:  Anti-infectives: Anti-infectives (From admission, onward)    None        Assessment/Plan: Patient Active Problem List   Diagnosis Date Noted   Colovesical fistula 06/02/2023   Rib fractures 06/02/2023   Hypokalemia 06/02/2023   QT prolongation 06/02/2023   Essential hypertension 06/02/2023   Bowel obstruction (HCC) 06/01/2023   Mr. Wayne Lowe is a very pleasant [phone] with hx of HTN, HLD, BPH here with evident sigmoidal stricture, presumed diverticular, with associated obstruction  - The anatomy and physiology of the GI tract was discussed with the patient. The pathophysiology of diverticular type strictures was discussed as  well. - Unless things were to dramatically change overnight tonight, we discussed plan to proceed to surgery to alleviate his evident diverticular type obstruction.  We discussed exploratory laparotomy, sigmoidectomy, and end colostomy.  Possible repair of bladder if a evident colovesical fistula identified with a bladder defect associated with it.   - The planned procedure, material risks (including, but not limited to, pain, bleeding, infection, scarring, need for blood transfusion, damage to surrounding structures- blood vessels/nerves/viscus/organs, damage to ureter, urine leak, leak from anastomosis, need for additional procedures, low anterior resection syndrome (LARS) = increased fecal urgency and/or frequency, worsening of pre-existing medical conditions, chronic diarrhea, constipation secondary to narcotic use, hernia, recurrence, pneumonia, heart attack, stroke, death) benefits and alternatives to surgery were discussed at length. The patient's questions were answered to he and his wife's satisfaction, they voiced understanding and elected to proceed with surgery. Additionally, we discussed typical postoperative expectations and the recovery process.  We also spent time discussing colostomy and general expectations therein.  They expressed clear understanding of all this.  High medical decision making - decision for surgery   LOS: 3 days   Marin Olp, MD [facility]

## 2023-06-04 NOTE — Progress Notes (Signed)
 WOC called to mark patient

## 2023-06-05 ENCOUNTER — Encounter (HOSPITAL_COMMUNITY): Payer: Self-pay | Admitting: Surgery

## 2023-06-05 DIAGNOSIS — K56609 Unspecified intestinal obstruction, unspecified as to partial versus complete obstruction: Secondary | ICD-10-CM | POA: Diagnosis not present

## 2023-06-05 LAB — CBC WITH DIFFERENTIAL/PLATELET
Abs Immature Granulocytes: 0.03 10*3/uL (ref 0.00–0.07)
Basophils Absolute: 0 10*3/uL (ref 0.0–0.1)
Basophils Relative: 0 %
Eosinophils Absolute: 0 10*3/uL (ref 0.0–0.5)
Eosinophils Relative: 0 %
HCT: 43.2 % (ref 39.0–52.0)
Hemoglobin: 13.7 g/dL (ref 13.0–17.0)
Immature Granulocytes: 1 %
Lymphocytes Relative: 13 %
Lymphs Abs: 0.7 10*3/uL (ref 0.7–4.0)
MCH: 29.5 pg (ref 26.0–34.0)
MCHC: 31.7 g/dL (ref 30.0–36.0)
MCV: 92.9 fL (ref 80.0–100.0)
Monocytes Absolute: 0.6 10*3/uL (ref 0.1–1.0)
Monocytes Relative: 12 %
Neutro Abs: 4.1 10*3/uL (ref 1.7–7.7)
Neutrophils Relative %: 74 %
Platelets: 306 10*3/uL (ref 150–400)
RBC: 4.65 MIL/uL (ref 4.22–5.81)
RDW: 13.8 % (ref 11.5–15.5)
WBC: 5.5 10*3/uL (ref 4.0–10.5)
nRBC: 0 % (ref 0.0–0.2)

## 2023-06-05 LAB — BASIC METABOLIC PANEL
Anion gap: 9 (ref 5–15)
BUN: 21 mg/dL (ref 8–23)
CO2: 23 mmol/L (ref 22–32)
Calcium: 8.6 mg/dL — ABNORMAL LOW (ref 8.9–10.3)
Chloride: 104 mmol/L (ref 98–111)
Creatinine, Ser: 0.82 mg/dL (ref 0.61–1.24)
GFR, Estimated: >60 mL/min (ref >60)
Glucose, Bld: 99 mg/dL (ref 70–99)
Potassium: 3.2 mmol/L — ABNORMAL LOW (ref 3.5–5.1)
Sodium: 136 mmol/L (ref 135–145)

## 2023-06-05 MED ORDER — METHOCARBAMOL 1000 MG/10ML IJ SOLN
1000.0000 mg | Freq: Three times a day (TID) | INTRAMUSCULAR | Status: DC
  Administered 2023-06-05 – 2023-06-10 (×13): 1000 mg via INTRAVENOUS
  Filled 2023-06-05 (×15): qty 10

## 2023-06-05 MED ORDER — POTASSIUM CHLORIDE 20 MEQ PO PACK
40.0000 meq | PACK | ORAL | Status: AC
  Administered 2023-06-05: 40 meq via ORAL
  Filled 2023-06-05 (×2): qty 2

## 2023-06-05 MED ORDER — POTASSIUM CHLORIDE 10 MEQ/100ML IV SOLN
10.0000 meq | INTRAVENOUS | Status: AC
  Administered 2023-06-05 (×4): 10 meq via INTRAVENOUS
  Filled 2023-06-05 (×4): qty 100

## 2023-06-05 MED ORDER — KETOROLAC TROMETHAMINE 15 MG/ML IJ SOLN
15.0000 mg | Freq: Four times a day (QID) | INTRAMUSCULAR | Status: AC
  Administered 2023-06-05 – 2023-06-10 (×18): 15 mg via INTRAVENOUS
  Filled 2023-06-05 (×21): qty 1

## 2023-06-05 MED ORDER — HYDRALAZINE HCL 20 MG/ML IJ SOLN
10.0000 mg | Freq: Once | INTRAMUSCULAR | Status: AC
  Administered 2023-06-05: 10 mg via INTRAVENOUS
  Filled 2023-06-05: qty 1

## 2023-06-05 MED ORDER — ALUM & MAG HYDROXIDE-SIMETH 200-200-20 MG/5ML PO SUSP
30.0000 mL | Freq: Four times a day (QID) | ORAL | Status: DC | PRN
  Administered 2023-06-06 – 2023-06-08 (×2): 30 mL via ORAL
  Filled 2023-06-05 (×2): qty 30

## 2023-06-05 MED ORDER — CHLORHEXIDINE GLUCONATE CLOTH 2 % EX PADS
6.0000 | MEDICATED_PAD | Freq: Every day | CUTANEOUS | Status: DC
  Administered 2023-06-05 – 2023-06-09 (×5): 6 via TOPICAL

## 2023-06-05 MED ORDER — LIDOCAINE 5 % EX PTCH
1.0000 | MEDICATED_PATCH | CUTANEOUS | Status: DC
  Administered 2023-06-05 – 2023-06-10 (×6): 1 via TRANSDERMAL
  Filled 2023-06-05 (×7): qty 1

## 2023-06-05 NOTE — TOC Progression Note (Signed)
 Transition of Care [facility]) - Progression Note    Patient Details  Name: SABAN HEINLEN MRN: [id number] Date of Birth: Oct 07, 1950  [facility] CM/SW Contact  Otelia Santee, LCSW Phone Number: 06/05/2023, 1:33 PM  Clinical Narrative:    Met with pt's wife at bedside to discuss recommendation for [facility]. Pt's spouse reports that she believes pt will progress to no longer need HH services prior to pt discharging from the hospital. TOC will follow up closer to pt being medically stable for discharge to review need for [facility].         Expected Discharge Plan and Services                                               Social Determinants of Health (SDOH) Interventions SDOH Screenings   Food Insecurity: No Food Insecurity (06/02/2023)  Housing: Low Risk  (06/02/2023)  Transportation Needs: No Transportation Needs (06/02/2023)  Utilities: Not At Risk (06/02/2023)  Social Connections: Moderately Integrated (06/02/2023)  Tobacco Use: Medium Risk (06/04/2023)    Readmission Risk Interventions    06/03/2023    3:11 PM  Readmission Risk Prevention Plan  Post Dischage Appt Complete  Medication Screening Complete  Transportation Screening Complete

## 2023-06-05 NOTE — Evaluation (Signed)
 Physical Therapy Evaluation Patient Details Name: Wayne Lowe MRN: [id number] DOB: 12-Jun-1950 Today's Date: 06/05/2023  History of Present Illness  73 yo male admitted with bowel obstruction. 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 Pt also found to have some rib fxs on imaging. Hx of COVID, anxiety, dizziness, tremor, LBP, RLS, memory changes.  Clinical Impression  On eval, pt required Min A for mobility. He walked ~75 feet with a RW. Moderate pain with activity. O2 >90% on RA. Pt participated well. Will plan to follow and progress activity as tolerated.         If plan is discharge home, recommend the following: A little help with walking and/or transfers;A little help with bathing/dressing/bathroom;Assistance with cooking/housework;Assist for transportation;Help with stairs or ramp for entrance   Can travel by private vehicle        Equipment Recommendations None recommended by PT  Recommendations for Other Services       Functional Status Assessment Patient has had a recent decline in their functional status and demonstrates the ability to make significant improvements in function in a reasonable and predictable amount of time.     Precautions / Restrictions Precautions Precautions: Fall Precaution/Restrictions Comments: L colostmy Restrictions Weight Bearing Restrictions Per Provider Order: No      Mobility  Bed Mobility Overal bed mobility: Needs Assistance Bed Mobility: Supine to Sit, Sit to Supine     Supine to sit: Min assist, HOB elevated Sit to supine: Contact guard assist   General bed mobility comments: Assist for trunk. Cues provided. Increased time.    Transfers Overall transfer level: Needs assistance Equipment used: Rolling walker (2 wheels) Transfers: Sit to/from Stand Sit to Stand: Min assist           General transfer comment: Assist to rise, steady, control descent. Cues for safety, hand placement     Ambulation/Gait Ambulation/Gait assistance: Min assist Gait Distance (Feet): 75 Feet Assistive device: Rolling walker (2 wheels) Gait Pattern/deviations: Step-through pattern, Decreased stride length       General Gait Details: LOB x 1 posteriorly. Assist to stabilize throughout distance. Cues for safety, proper operation of RW. Tolerated distance well.  Stairs            Wheelchair Mobility     Tilt Bed    Modified Rankin (Stroke Patients Only)       Balance Overall balance assessment: Needs assistance         Standing balance support: Bilateral upper extremity supported, During functional activity, Reliant on assistive device for balance Standing balance-Leahy Scale: Poor                               Pertinent Vitals/Pain Pain Assessment Pain Assessment: Faces Faces Pain Scale: Hurts even more Pain Location: abdomen Pain Descriptors / Indicators: Operative site guarding Pain Intervention(s): Limited activity within patient's tolerance, Monitored during session, Repositioned    Home Living Family/patient expects to be discharged to:: Private residence Living Arrangements: Spouse/significant other Available Help at Discharge: Family Type of Home: House           Home Equipment: Rollator (4 wheels)      Prior Function Prior Level of Function : Independent/Modified Independent                     Extremity/Trunk Assessment   Upper Extremity Assessment Upper Extremity Assessment: Defer to OT evaluation    Lower Extremity Assessment Lower Extremity  Assessment: Generalized weakness    Cervical / Trunk Assessment Cervical / Trunk Assessment: Normal  Communication   Communication Communication: No apparent difficulties    Cognition Arousal: Alert Behavior During Therapy: WFL for tasks assessed/performed   PT - Cognitive impairments: No apparent impairments                         Following commands:  Intact       Cueing Cueing Techniques: Verbal cues     General Comments      Exercises     Assessment/Plan    PT Assessment Patient needs continued PT services  PT Problem List Decreased strength;Decreased range of motion;Decreased activity tolerance;Decreased balance;Decreased mobility;Decreased knowledge of use of DME;Pain       PT Treatment Interventions DME instruction;Gait training;Functional mobility training;Therapeutic activities;Therapeutic exercise;Balance training;Patient/family education    PT Goals (Current goals can be found in the Care Plan section)  Acute Rehab PT Goals Patient Stated Goal: regain plof/independence PT Goal Formulation: With patient/family Time For Goal Achievement: 06/19/23 Potential to Achieve Goals: Good    Frequency Min 2X/week     Co-evaluation               AM-PAC PT "6 Clicks" Mobility  Outcome Measure Help needed turning from your back to your side while in a flat bed without using bedrails?: A Little Help needed moving from lying on your back to sitting on the side of a flat bed without using bedrails?: A Little Help needed moving to and from a bed to a chair (including a wheelchair)?: A Little Help needed standing up from a chair using your arms (e.g., wheelchair or bedside chair)?: A Little Help needed to walk in hospital room?: A Little Help needed climbing 3-5 steps with a railing? : A Little 6 Click Score: 18    End of Session Equipment Utilized During Treatment: Gait belt Activity Tolerance: Patient tolerated treatment well Patient left: in bed;with call bell/phone within reach;with bed alarm set;with family/visitor present   PT Visit Diagnosis: Unsteadiness on feet (R26.81);Muscle weakness (generalized) (M62.81);Pain Pain - part of body:  (abdomen-post op)    Time: 1610-9604 PT Time Calculation (min) (ACUTE ONLY): 19 min   Charges:   PT Evaluation $PT Eval Low Complexity: 1 Low   PT General Charges $$  ACUTE PT VISIT: 1 Visit           Faye Ramsay, PT Acute Rehabilitation  Office: [phone]

## 2023-06-05 NOTE — Progress Notes (Addendum)
 [facility] Progress Note  1 Day Post-Op  Subjective: CC:  Reports incisional pain, worse with movement or coughing. Tolerating CLD without nausea or vomiting. Reports gas/stool via ostomy.   Objective: Vital signs in last 24 hours: Temp:  [97.5 F (36.4 C)-98.8 F (37.1 C)] 97.7 F (36.5 C) (03/05 0601) Pulse Rate:  [65-79] 75 (03/05 0601) Resp:  [16-23] 16 (03/05 0601) BP: (159-173)/(74-98) 162/85 (03/05 0601) SpO2:  [95 %-100 %] 99 % (03/05 0601) Last BM Date : 06/04/23  Intake/Output from previous day: 03/04 0701 - 03/05 0700 In: 2969.3 [I.V.:2519.3; IV Piggyback:450] Out: 1580 [Urine:630; Stool:850; Blood:100] Intake/Output this shift: No intake/output data recorded.  PE: Gen:  Alert, NAD, pleasant Card:  Regular rate and rhythm, pedal pulses 2+ BL Pulm:  Normal effort Abd: firm, moderate disetntion, incision C/D/I, ostomy appliace recently emptied - stoma appears edematous and viable, scant stool at os. No gas/stool in pouch. Skin: warm and dry, no rashes  Psych: A&Ox3   Lab Results:  Recent Labs    06/03/23 0613 06/05/23 0500  WBC 10.2 5.5  HGB 14.0 13.7  HCT 43.5 43.2  PLT 342 306   BMET Recent Labs    06/03/23 0613 06/05/23 0500  NA 139 136  K 4.1 3.2*  CL 108 104  CO2 22 23  GLUCOSE 111* 99  BUN 15 21  CREATININE 0.70 0.82  CALCIUM 9.1 8.6*   PT/INR No results for input(s): "LABPROT", "INR" in the last 72 hours. CMP     Component Value Date/Time   NA 136 06/05/2023 0500   K 3.2 (L) 06/05/2023 0500   CL 104 06/05/2023 0500   CO2 23 06/05/2023 0500   GLUCOSE 99 06/05/2023 0500   BUN 21 06/05/2023 0500   CREATININE 0.82 06/05/2023 0500   CALCIUM 8.6 (L) 06/05/2023 0500   PROT 9.0 (H) 06/01/2023 1800   ALBUMIN 3.8 06/01/2023 1800   AST 23 06/01/2023 1800   ALT 17 06/01/2023 1800   ALKPHOS 65 06/01/2023 1800   BILITOT 1.1 06/01/2023 1800   GFRNONAA >60 06/05/2023 0500   Lipase     Component Value Date/Time   LIPASE  25 06/01/2023 1800       Studies/Results: X-ray abdomen AP Result Date: 06/04/2023 CLINICAL DATA:  NG tube placement. EXAM: ABDOMEN - 1 VIEW COMPARISON:  CT 06/01/2023 FINDINGS: Tip and side port of the enteric tube below the diaphragm in the stomach. Dilated loop of bowel in the mid upper abdomen likely represents transverse colon when compared with prior CT. IMPRESSION: Tip and side port of the enteric tube below the diaphragm in the stomach. Electronically Signed   By: Narda Rutherford M.D.   On: 06/04/2023 16:09    Anti-infectives: Anti-infectives (From admission, onward)    Start     Dose/Rate Route Frequency Ordered Stop   06/04/23 0930  cefoTEtan (CEFOTAN) 2 g in sodium chloride 0.9 % 100 mL IVPB       Note to Pharmacy: Pharmacy may adjust dose strength for optimal dosing.   Send with patient on call to the OR.  Anesthesia to complete antibiotic administration <41min prior to incision per [facility]   2 g 200 mL/hr over 30 Minutes Intravenous On call to O.R. 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 Cliffton Asters  - POD#1, AFVSS, hgb stable 13.7 (14) - having some bowel function via colostomy - advance to FLD and monitor tolerance - WOC RN following for ostomy education - add toradol for pain, increase robaxin dose - can likely D/C foley today, will confirm with MD    LOS: 4 days   I reviewed nursing notes, hospitalist notes, last 24 h vitals and pain scores, last 48 h intake and output, last 24 h labs and trends, and last 24 h imaging results.   Hosie Spangle, PA-C [facility] Please see Amion for pager number during day hours 7:00am-4:30pm

## 2023-06-05 NOTE — Progress Notes (Signed)
 [facility] Progress Note  SUBJECTIVE:   Interval history: Wayne Lowe was seen and evaluated today at bedside.  Resting supine in bed.  Noted that he had worked with PT today which was very tiring.  Having abdominal discomfort, distention.  Having intermittent ostomy output.  Tolerating liquids.  Past Medical History:  Diagnosis Date   Anxiety    COVID-19    Diverticulitis    Dizziness    ED (erectile dysfunction) of non-organic origin    Enlarged prostate without lower urinary tract symptoms (luts)    Fluctuating blood pressure    History of BPH    HTN (hypertension)    Hypercholesteremia    Impaired fasting glucose    Memory changes    Moderate major depression (HCC)    Recurrent low back pain    Restless leg    Sleep disorder    Tremor    TSH deficiency    Past Surgical History:  Procedure Laterality Date   FLEXIBLE SIGMOIDOSCOPY N/A 06/03/2023   Procedure: Arnell Sieving;  Surgeon: Lynann Bologna, DO;  Location: [facility];  Service: Gastroenterology;  Laterality: N/A;   PARTIAL COLECTOMY N/A 06/04/2023   Procedure: Exploratory laparotomy with sigmoid colectomy and end colostomy (Hartmann's procedure);  Surgeon: Andria Meuse, MD;  Location: [facility] ORS;  Service: General;  Laterality: N/A;   Current Facility-Administered Medications  Medication Dose Route Frequency Provider Last Rate Last Admin   acetaminophen (OFIRMEV) IV 1,000 mg  1,000 mg Intravenous Q6H Juliet Rude, PA-C 400 mL/hr at 06/05/23 1230 1,000 mg at 06/05/23 1230   Chlorhexidine Gluconate Cloth 2 % PADS 6 each  6 each Topical Daily Hughie Closs, MD   6 each at 06/05/23 1230   diphenhydrAMINE (BENADRYL) injection 12.5-25 mg  12.5-25 mg Intravenous QHS PRN Juliet Rude, PA-C       feeding supplement (BOOST / RESOURCE BREEZE) liquid 1 Container  1 Container Oral TID BM Hughie Closs, MD   1 Container at 06/05/23 1003   hydrALAZINE (APRESOLINE) injection 10 mg  10 mg  Intravenous Q6H PRN Liliane Shi H, DO       ketorolac (TORADOL) 15 MG/ML injection 15 mg  15 mg Intravenous Q6H Simaan, Francine Graven, PA-C   15 mg at 06/05/23 1222   lactated ringers infusion   Intravenous Continuous Juliet Rude, PA-C 75 mL/hr at 06/04/23 1455 New Bag at 06/04/23 1455   lidocaine (LIDODERM) 5 % 1 patch  1 patch Transdermal Q24H Adam Phenix, PA-C   1 patch at 06/05/23 1222   LORazepam (ATIVAN) injection 0.5 mg  0.5 mg Intravenous Q6H PRN Juliet Rude, PA-C   0.5 mg at 06/04/23 1855   methocarbamol (ROBAXIN) injection 1,000 mg  1,000 mg Intravenous Q8H Simaan, Elizabeth S, PA-C       morphine (PF) 2 MG/ML injection 2 mg  2 mg Intravenous Q2H PRN Juliet Rude, PA-C   2 mg at 06/05/23 0447   naloxone [facility]) injection 0.4 mg  0.4 mg Intravenous PRN Liliane Shi H, DO       oxyCODONE (Oxy IR/ROXICODONE) immediate release tablet 5-10 mg  5-10 mg Oral Q4H PRN Juliet Rude, PA-C   10 mg at 06/04/23 2131   potassium chloride (KLOR-CON) packet 40 mEq  40 mEq Oral Q4H Hughie Closs, MD   40 mEq at 06/05/23 1003   prochlorperazine (COMPAZINE) injection 10 mg  10 mg Intravenous Q6H PRN Liliane Shi H, DO   10 mg at  06/03/23 0744   trimethobenzamide (TIGAN) injection 200 mg  200 mg Intramuscular Q8H PRN Liliane Shi H, DO       Allergies as of 06/01/2023 - Review Complete 06/01/2023  Allergen Reaction Noted   Buspar [buspirone]  01/21/2023   Crestor [rosuvastatin]  01/21/2023   Erythromycin  01/21/2023   Lipitor [atorvastatin]  01/21/2023   Lovastatin  01/21/2023   Pravachol [pravastatin]  01/21/2023   Wellbutrin [bupropion]  01/21/2023   Review of Systems:  Review of Systems  Respiratory:  Positive for shortness of breath.   Cardiovascular:  Negative for chest pain.  Gastrointestinal:  Positive for abdominal pain.       Abdominal distention    OBJECTIVE:   Temp:  [97.7 F (36.5 C)-98.8 F (37.1 C)] 97.7 F (36.5 C) (03/05 0601) Pulse  Rate:  [69-79] 75 (03/05 0601) Resp:  [16-23] 16 (03/05 0601) BP: (159-173)/(76-98) 162/85 (03/05 0601) SpO2:  [95 %-99 %] 99 % (03/05 0601) Last BM Date : 06/04/23 Physical Exam Constitutional:      General: He is not in acute distress.    Appearance: He is not ill-appearing, toxic-appearing or diaphoretic.  Cardiovascular:     Rate and Rhythm: Normal rate and regular rhythm.  Pulmonary:     Effort: No respiratory distress.     Breath sounds: Normal breath sounds.  Abdominal:     General: Bowel sounds are normal. There is distension.     Palpations: Abdomen is soft.     Tenderness: There is abdominal tenderness. There is no guarding.  Skin:    General: Skin is warm and dry.  Neurological:     Mental Status: He is alert.     Labs: Recent Labs    06/03/23 0613 06/05/23 0500  WBC 10.2 5.5  HGB 14.0 13.7  HCT 43.5 43.2  PLT 342 306   BMET Recent Labs    06/03/23 0613 06/05/23 0500  NA 139 136  K 4.1 3.2*  CL 108 104  CO2 22 23  GLUCOSE 111* 99  BUN 15 21  CREATININE 0.70 0.82  CALCIUM 9.1 8.6*   LFT No results for input(s): "PROT", "ALBUMIN", "AST", "ALT", "ALKPHOS", "BILITOT", "BILIDIR", "IBILI" in the last 72 hours. PT/INR No results for input(s): "LABPROT", "INR" in the last 72 hours. Diagnostic imaging: X-ray abdomen AP Result Date: 06/04/2023 CLINICAL DATA:  NG tube placement. EXAM: ABDOMEN - 1 VIEW COMPARISON:  CT 06/01/2023 FINDINGS: Tip and side port of the enteric tube below the diaphragm in the stomach. Dilated loop of bowel in the mid upper abdomen likely represents transverse colon when compared with prior CT. IMPRESSION: Tip and side port of the enteric tube below the diaphragm in the stomach. Electronically Signed   By: Narda Rutherford M.D.   On: 06/04/2023 16:09   IMPRESSION: Sigmoid obstruction secondary to stricture  -Flexible sigmoidoscopy 06/03/2023 with pale/boggy mucosa at 30 cm  -Status post ex lap with sigmoid colectomy and end colostomy on  06/04/2023 Personal history sigmoid and descending colon diverticulosis Diverticulitis July 2024 History hypertension  PLAN: -Your general surgery management, diet recommendations -Patient can follow-up with [facility] in the outpatient setting to consider full colonoscopy, likely 6 months after surgery -[facility] will sign off and be available should any questions arise   LOS: 4 days   Liliane Shi, [facility]

## 2023-06-05 NOTE — Evaluation (Signed)
 Occupational Therapy Evaluation Patient Details Name: Wayne Lowe MRN: [id number] DOB: July 28, 1950 Today's Date: 06/05/2023   History of Present Illness   73 yo male admitted with bowel obstruction. 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 Pt also found to have some rib fxs on imaging. Hx of COVID, anxiety, dizziness, tremor, LBP, RLS, memory changes.     Clinical Impressions Pt presents with decline in function and safety with ADLs and ADL mobility with impaired balance and endurance; LE ADLs impaired due to post op ABD pain. PTA pt lives with his wife and was Ind with ADLs/selfcare, IADLs, plays golf and drives. Pt currently requires mod A with LB ADLs and min A with mobility/transfers using RW. Pt will have 24/7 assist a needed at home for his wife. Pt would benefit from acute OT services to address impairments to maximize level of function and safety     If plan is discharge home, recommend the following:   A little help with bathing/dressing/bathroom;A little help with walking and/or transfers;Assist for transportation;Help with stairs or ramp for entrance     Functional Status Assessment   Patient has had a recent decline in their functional status and demonstrates the ability to make significant improvements in function in a reasonable and predictable amount of time.     Equipment Recommendations   Other (comment) (LH bath sponge, reacher)     Recommendations for Other Services         Precautions/Restrictions   Precautions Precautions: Fall Precaution/Restrictions Comments: L colostmy Restrictions Weight Bearing Restrictions Per Provider Order: No     Mobility Bed Mobility                    Transfers                          Balance                                           ADL either performed or assessed with clinical judgement   ADL Overall ADL's : Needs assistance/impaired Eating/Feeding:  Independent   Grooming: Wash/dry hands;Wash/dry face;Contact guard assist;Standing   Upper Body Bathing: Set up   Lower Body Bathing: Moderate assistance;With caregiver independent assisting   Upper Body Dressing : Set up   Lower Body Dressing: Moderate assistance;With caregiver independent assisting   Toilet Transfer: Minimal assistance;Ambulation;Rolling walker (2 wheels)   Toileting- Clothing Manipulation and Hygiene: Contact guard assist       Functional mobility during ADLs: Minimal assistance;Rolling walker (2 wheels);Cueing for safety General ADL Comments: pt and wife educated on DME for bathroom and LH bath sponge for LB bathing for home use     Vision Baseline Vision/History: 1 Wears glasses Ability to See in Adequate Light: 0 Adequate Patient Visual Report: No change from baseline       Perception         Praxis         Pertinent Vitals/Pain Pain Assessment Pain Assessment: No/denies pain Faces Pain Scale: Hurts even more Pain Descriptors / Indicators: Operative site guarding Pain Intervention(s): Patient requesting pain meds-RN notified, Limited activity within patient's tolerance, Repositioned     Extremity/Trunk Assessment Upper Extremity Assessment Upper Extremity Assessment: Overall WFL for tasks assessed   Lower Extremity Assessment Lower Extremity Assessment: Defer to PT evaluation   Cervical / Trunk Assessment  Cervical / Trunk Assessment: Normal   Communication Communication Communication: No apparent difficulties   Cognition Arousal: Alert Behavior During Therapy: WFL for tasks assessed/performed Cognition: No apparent impairments             OT - Cognition Comments: pleasant, cooperative, jovial                 Following commands: Intact       Cueing  General Comments   Cueing Techniques: Verbal cues      Exercises     Shoulder Instructions      Home Living Family/patient expects to be discharged to::  Private residence Living Arrangements: Spouse/significant other Available Help at Discharge: Family Type of Home: House Home Access: Stairs to enter Secretary/administrator of Steps: 2   Home Layout: One level     Bathroom Shower/Tub: Walk-in shower;Tub/shower unit   Bathroom Toilet: Handicapped height     Home Equipment: Rollator (4 wheels);BSC/3in1   Additional Comments: has DME from his mothe in law      Prior Functioning/Environment Prior Level of Function : Independent/Modified Independent;Driving;Other (comment) (plays golf)                    OT Problem List: Impaired balance (sitting and/or standing);Decreased activity tolerance;Decreased knowledge of use of DME or AE;Pain   OT Treatment/Interventions: Self-care/ADL training;DME and/or AE instruction;Therapeutic activities;Patient/family education      OT Goals(Current goals can be found in the care plan section)   Acute Rehab OT Goals Patient Stated Goal: go home OT Goal Formulation: With patient/family Time For Goal Achievement: 06/19/23 Potential to Achieve Goals: Good ADL Goals Pt Will Perform Grooming: with supervision;with set-up;standing;with caregiver independent in assisting Pt Will Perform Lower Body Bathing: with min assist;with adaptive equipment;with caregiver independent in assisting Pt Will Perform Lower Body Dressing: with min assist;with adaptive equipment;with caregiver independent in assisting;sit to/from stand Pt Will Transfer to Toilet: with contact guard assist;with supervision;ambulating Pt Will Perform Toileting - Clothing Manipulation and hygiene: with supervision;with modified independence;sit to/from stand;with caregiver independent in assisting   OT Frequency:  Min 2X/week    Co-evaluation              AM-PAC OT "6 Clicks" Daily Activity     Outcome Measure Help from another person eating meals?: None Help from another person taking care of personal grooming?: A  Little Help from another person toileting, which includes using toliet, bedpan, or urinal?: A Little Help from another person bathing (including washing, rinsing, drying)?: A Little Help from another person to put on and taking off regular upper body clothing?: A Little Help from another person to put on and taking off regular lower body clothing?: A Little 6 Click Score: 19   End of Session Equipment Utilized During Treatment: Gait belt;Rolling walker (2 wheels)  Activity Tolerance: Patient tolerated treatment well Patient left: in bed;with call bell/phone within reach;with family/visitor present  OT Visit Diagnosis: Unsteadiness on feet (R26.81);Pain Pain - part of body:  (ABD-post op pain)                Time: 1610-9604 OT Time Calculation (min): 20 min Charges:  OT General Charges $OT Visit: 1 Visit OT Evaluation $OT Eval Low Complexity: 1 Low    Galen Manila 06/05/2023, 1:34 PM

## 2023-06-05 NOTE — Consult Note (Signed)
 WOC Nurse ostomy consult note Stoma type/location:  LUQ, colostomy  Stomal assessment/size: pink, moist, minimally budded;  1 1/2" x 1 3/4" oval, dips in abdominal topography at 9 o'clock; has been leaking at this point towards midline incision  Peristomal assessment:  intact  Treatment options for stomal/peristomal skin: 2" skin barrier ring  Output scant, bloody Ostomy pouching: 1pc soft convex, 2" barrier ring, may need ostomy belt Education provided:  Met with patient and his wife, patient is not engaged today to participate, has recently been up with PT. Wife however is attentive and engaged to learn care.  Explained rationale for leaking, showing her the dips in the skin. Explained rationale for convex pouching and barrier ring. Explained role of ostomy nurse and creation of stoma  Explained stoma characteristics (budded, flush, color, texture, care) Demonstrated pouch change (cutting new skin barrier, measuring stoma, cleaning peristomal skin and stoma, use of barrier ring) Explained rationale for off centering aperture  of opening in skin barrier  Education on emptying when 1/3 to 1/2 full and how   Provided patient's wife with educational materials, patient is polite but asked me to finish session abruptly. Pouch has been changed, no additional teaching today per patient's request.  Enrolled patient in [employer] DC program: Yes  Ordered 1pc soft convex and belt for patient's room. Will plan to see patient again on Friday for pouch change. Wife is aware.   Navpreet Szczygiel [facility], CNS, [employer] [phone]

## 2023-06-05 NOTE — Progress Notes (Signed)
 PROGRESS NOTE    Wayne Lowe  ZOX:[id number] DOB: 02/04/1951 DOA: 06/01/2023 PCP: [facility] [place]   Brief Narrative:  Wayne Lowe is a 73 y.o. male with medical history significant of hypertension, hyperlipidemia, diverticulitis, anxiety, depression, BPH, GERD presented to the ED for evaluation of abdominal pain/distention, nausea, vomiting, and constipation that is been going on for about a week.  Upon arrival to ED, he was hemodynamically stable.  CT abdomen showed colonic dilatation with obstruction, possible diverticulitis with suspicion of mass/malignancy as well as possibility of colovesical fistula.  Patient admitted under hospitalist service, GI consulted, GI recommended holding off antibiotics.  Assessment & Plan:   Principal Problem:   Bowel obstruction (HCC) Active Problems:   Colovesical fistula   Rib fractures   Hypokalemia   QT prolongation   Essential hypertension  Sigmoid colon obstruction / Diverticulitis versus possible malignant obstruction CT showing diffuse colonic dilation with obstruction at the level of the sigmoid colon and there is mild adjacent fat stranding which could be secondary to ongoing diverticulitis, however, a malignant obstruction needs to be excluded by endoscopy.  Borderline leukocytosis on labs, no fever or signs of sepsis.  [facility], underwent flexible sigmoidoscopy 06/03/23 which demonstrated a intrinsic stricture of the sigmoid colon that was unable to be traversed. There is no evident mass within his colon.  Per GI recommendations, general surgery consulted and patient underwent Exploratory laparotomy with sigmoid colectomy and end colostomy (Hartmann's procedure) by Dr. Cliffton Asters 06/04/2023, has been started on clear liquid diet.  Appreciate general surgery help and management per them.    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  However, UA is not suggestive of infection.  Per general surgery.   Right 9th and 10th rib fractures Discussed with the patient and fractures appear to be subacute as he reports slipping on ice and falling 6 weeks ago.  Initially had pain at that time but no longer having any rib pain or discomfort now.   Right hepatic lobe lesion CT showing a stable 1.5 cm hyperenhancing lesion in the right hepatic lobe favored to be a hemangioma per radiologist.  However, definitive diagnosis would require multiphasic CT or MRI.  GI consulted.   Mild hypokalemia: Replenished.  QT prolongation QTc 521 on EKG.  Monitor potassium and magnesium levels, continue to replace as needed.  Avoid QT prolonging drugs and follow-up repeat EKG in the morning.   Mild metabolic acidosis Resolved with IV fluids.   Hypertension SBP currently in the 150s.  IV hydralazine PRN SBP >160.  DVT prophylaxis: SCDs Start: 06/01/23 2334   Code Status: Full Code  Family Communication: None present at bedside.  Plan of care discussed with patient in length and he/she verbalized understanding and agreed with it.  Status is: Inpatient Remains inpatient appropriate because: Recovering from laparotomy.  Will need slow advancement of the diet.  Will be discharged when cleared by general surgery.  Estimated body mass index is 28.81 kg/m as calculated from the following:   Height as of this encounter: 5\' 9"  (1.753 m).   Weight as of this encounter: 88.5 kg.    Nutritional Assessment: Body mass index is 28.81 kg/m.Marland Kitchen Seen by dietician.  I agree with the assessment and plan as outlined below: Nutrition Status:        . Skin Assessment: I have examined the patient's skin and  I agree with the wound assessment as performed by the wound care RN as outlined below:    Consultants:  GI  Procedures:  As above  Antimicrobials:  Anti-infectives (From admission, onward)    Start     Dose/Rate Route  Frequency Ordered Stop   06/04/23 0930  cefoTEtan (CEFOTAN) 2 g in sodium chloride 0.9 % 100 mL IVPB       Note to Pharmacy: Pharmacy may adjust dose strength for optimal dosing.   Send with patient on call to the OR.  Anesthesia to complete antibiotic administration <18min prior to incision per [facility].   2 g 200 mL/hr over 30 Minutes Intravenous On call to O.R. 06/04/23 1610 06/04/23 1006   06/04/23 0927  sodium chloride 0.9 % with cefoTEtan (CEFOTAN) ADS Med       Note to Pharmacy: Raelyn Number J: cabinet override      06/04/23 0927 06/04/23 1001         Subjective: Patient seen and examined.  He says that his pain is better controlled and he had a good sleep last night.  No other complaint.  Continues to remain motivated with positive attitude.  Objective: Vitals:   06/04/23 1400 06/04/23 1730 06/04/23 1936 06/05/23 0601  BP: (!) 166/83 (!) 171/78 (!) 163/98 (!) 162/85  Pulse:  74 79 75  Resp: 20 (!) 22 16 16   Temp:  98 F (36.7 C) 98.8 F (37.1 C) 97.7 F (36.5 C)  TempSrc:      SpO2: 95% 98% 98% 99%  Weight:      Height:        Intake/Output Summary (Last 24 hours) at 06/05/2023 0844 Last data filed at 06/05/2023 0618 Gross per 24 hour  Intake 2969.25 ml  Output 1580 ml  Net 1389.25 ml   Filed Weights   06/01/23 1333 06/03/23 1206 06/04/23 0822  Weight: 88.5 kg 88.5 kg 88.5 kg    Examination:  General exam: Appears calm and comfortable  Respiratory system: Clear to auscultation. Respiratory effort normal. Cardiovascular system: S1 & S2 heard, RRR. No JVD, murmurs, rubs, gallops or clicks. No pedal edema. Gastrointestinal system: Abdomen is soft, still distended, nontender, colostomy bag noted.  No organomegaly or masses felt. Normal bowel sounds heard. Central nervous system: Alert and oriented. No focal neurological deficits. Extremities: Symmetric 5 x 5 power. Skin: No rashes, lesions or ulcers.  Psychiatry: Judgement and insight appear normal. Mood &  affect appropriate.   Data Reviewed: I have personally reviewed following labs and imaging studies  CBC: Recent Labs  Lab 06/01/23 1800 06/02/23 0623 06/03/23 0613 06/05/23 0500  WBC 11.4* 10.9* 10.2 5.5  NEUTROABS 9.6*  --  8.4* 4.1  HGB 14.9 13.9 14.0 13.7  HCT 46.4 44.0 43.5 43.2  MCV 91.2 93.2 92.8 92.9  PLT 382 342 342 306   Basic Metabolic Panel: Recent Labs  Lab 06/01/23 1800 06/02/23 0623 06/03/23 0613 06/05/23 0500  NA 137 140 139 136  K 3.2* 3.9 4.1 3.2*  CL 103 110 108 104  CO2 19* 21* 22 23  GLUCOSE 133* 113* 111* 99  BUN 20 16 15 21   CREATININE 1.02 0.77 0.70 0.82  CALCIUM 9.3 8.8* 9.1 8.6*  MG  --  2.4  --   --    GFR: Estimated Creatinine Clearance: 87 mL/min (by C-G formula based on SCr of 0.82 mg/dL). Liver Function Tests: Recent Labs  Lab 06/01/23 1800  AST 23  ALT 17  ALKPHOS 65  BILITOT 1.1  PROT 9.0*  ALBUMIN 3.8   Recent Labs  Lab 06/01/23 1800  LIPASE 25   No results for input(s): "AMMONIA" in the last 168 hours. Coagulation Profile: No results for input(s): "INR", "PROTIME" in the last 168 hours. Cardiac Enzymes: No results for input(s): "CKTOTAL", "CKMB", "CKMBINDEX", "TROPONINI" in the last 168 hours. BNP (last 3 results) No results for input(s): "PROBNP" in the last 8760 hours. HbA1C: No results for input(s): "HGBA1C" in the last 72 hours. CBG: No results for input(s): "GLUCAP" in the last 168 hours. Lipid Profile: No results for input(s): "CHOL", "HDL", "LDLCALC", "TRIG", "CHOLHDL", "LDLDIRECT" in the last 72 hours. Thyroid Function Tests: No results for input(s): "TSH", "T4TOTAL", "FREET4", "T3FREE", "THYROIDAB" in the last 72 hours. Anemia Panel: No results for input(s): "VITAMINB12", "FOLATE", "FERRITIN", "TIBC", "IRON", "RETICCTPCT" in the last 72 hours. Sepsis Labs: No results for input(s): "PROCALCITON", "LATICACIDVEN" in the last 168 hours.  No results found for this or any previous visit (from the past 240  hours).   Radiology Studies: X-ray abdomen AP Result Date: 06/04/2023 CLINICAL DATA:  NG tube placement. EXAM: ABDOMEN - 1 VIEW COMPARISON:  CT 06/01/2023 FINDINGS: Tip and side port of the enteric tube below the diaphragm in the stomach. Dilated loop of bowel in the mid upper abdomen likely represents transverse colon when compared with prior CT. IMPRESSION: Tip and side port of the enteric tube below the diaphragm in the stomach. Electronically Signed   By: Narda Rutherford M.D.   On: 06/04/2023 16:09     Scheduled Meds:  feeding supplement  1 Container Oral TID BM   methocarbamol (ROBAXIN) injection  500 mg Intravenous Q8H   potassium chloride  40 mEq Oral Q4H   Continuous Infusions:  acetaminophen 1,000 mg (06/05/23 0620)   lactated ringers 75 mL/hr at 06/04/23 1455     LOS: 4 days   Hughie Closs, MD [facility]  06/05/2023, 8:44 AM   *Please note that this is a verbal dictation therefore any spelling or grammatical errors are due to the "Dragon Medical One" system interpretation.  Please page via Amion and do not message via secure chat for urgent patient care matters. Secure chat can be used for non urgent patient care matters.  How to contact the [facility] Attending or Consulting provider 7A - 7P or covering provider during after hours 7P -7A, for this patient?  Check the care team in [facility] and look for a) attending/consulting TRH provider listed and b) the [facility] team listed. Page or secure chat 7A-7P. Log into [email or web address] and use [place]'s universal password to access. If you do not have the password, please contact the hospital operator. Locate the [facility] provider you are looking for under [facility] and page to a number that you can be directly reached. If you still have difficulty reaching the provider, please page the [facility] (Director on Call) for the Hospitalists listed on amion for assistance.

## 2023-06-06 DIAGNOSIS — K56609 Unspecified intestinal obstruction, unspecified as to partial versus complete obstruction: Secondary | ICD-10-CM | POA: Diagnosis not present

## 2023-06-06 LAB — BASIC METABOLIC PANEL
Anion gap: 11 (ref 5–15)
BUN: 23 mg/dL (ref 8–23)
CO2: 25 mmol/L (ref 22–32)
Calcium: 8.8 mg/dL — ABNORMAL LOW (ref 8.9–10.3)
Chloride: 102 mmol/L (ref 98–111)
Creatinine, Ser: 0.74 mg/dL (ref 0.61–1.24)
GFR, Estimated: >60 mL/min (ref >60)
Glucose, Bld: 121 mg/dL — ABNORMAL HIGH (ref 70–99)
Potassium: 3.2 mmol/L — ABNORMAL LOW (ref 3.5–5.1)
Sodium: 138 mmol/L (ref 135–145)

## 2023-06-06 LAB — SURGICAL PATHOLOGY

## 2023-06-06 MED ORDER — FINASTERIDE 5 MG PO TABS
5.0000 mg | ORAL_TABLET | Freq: Every day | ORAL | Status: DC
  Administered 2023-06-06 – 2023-06-10 (×5): 5 mg via ORAL
  Filled 2023-06-06 (×5): qty 1

## 2023-06-06 MED ORDER — AMLODIPINE BESYLATE 5 MG PO TABS
5.0000 mg | ORAL_TABLET | Freq: Every day | ORAL | Status: DC
  Administered 2023-06-06 – 2023-06-07 (×2): 5 mg via ORAL
  Filled 2023-06-06 (×2): qty 1

## 2023-06-06 MED ORDER — TRAZODONE HCL 100 MG PO TABS
100.0000 mg | ORAL_TABLET | Freq: Every day | ORAL | Status: DC
  Administered 2023-06-06 – 2023-06-09 (×4): 100 mg via ORAL
  Filled 2023-06-06 (×4): qty 1

## 2023-06-06 MED ORDER — LABETALOL HCL 5 MG/ML IV SOLN
10.0000 mg | INTRAVENOUS | Status: AC | PRN
Start: 2023-06-06 — End: 2023-06-06
  Administered 2023-06-06 (×2): 10 mg via INTRAVENOUS
  Filled 2023-06-06 (×2): qty 4

## 2023-06-06 MED ORDER — POTASSIUM CHLORIDE 10 MEQ/100ML IV SOLN
10.0000 meq | INTRAVENOUS | Status: AC
Start: 2023-06-06 — End: 2023-06-06
  Administered 2023-06-06 (×5): 10 meq via INTRAVENOUS
  Filled 2023-06-06: qty 100

## 2023-06-06 MED ORDER — TAMSULOSIN HCL 0.4 MG PO CAPS
0.4000 mg | ORAL_CAPSULE | Freq: Every day | ORAL | Status: DC
  Administered 2023-06-06 – 2023-06-10 (×5): 0.4 mg via ORAL
  Filled 2023-06-06 (×5): qty 1

## 2023-06-06 MED ORDER — FLUOXETINE HCL 20 MG PO CAPS
80.0000 mg | ORAL_CAPSULE | ORAL | Status: DC
  Administered 2023-06-06 – 2023-06-09 (×4): 80 mg via ORAL
  Filled 2023-06-06 (×6): qty 4

## 2023-06-06 NOTE — Plan of Care (Signed)

## 2023-06-06 NOTE — Progress Notes (Signed)
 Pt has been intermittently agitated and wanting to leave. Applied mitts due to pt pulling at foley as well as IV and continuously removing tele. Wife was called to help keep pt calm.  B/P was also high see MAR for orders. Reoriented multiple times. Awoke feeling nauseous provided ice and cold wash cloth, fan and cracked window as pt wanted fresh air. pt refused meds.

## 2023-06-06 NOTE — Progress Notes (Signed)
 [facility] Progress Note  2 Days Post-Op  Subjective: CC:  Feeling well, tolerating liquids, hungry.  Ostomy working nicely  Objective: Vital signs in last 24 hours: Temp:  [98 F (36.7 C)-99 F (37.2 C)] 98 F (36.7 C) (03/06 0626) Pulse Rate:  [71-94] 79 (03/06 1005) Resp:  [16-18] 18 (03/06 0626) BP: (148-206)/(78-101) 188/89 (03/06 1005) SpO2:  [94 %-96 %] 95 % (03/06 0626) Last BM Date : 06/06/23  Intake/Output from previous day: 03/05 0701 - 03/06 0700 In: 240 [P.O.:240] Out: 1975 [Urine:825; Stool:1150] Intake/Output this shift: No intake/output data recorded.  PE: Gen:  Alert, NAD, pleasant Card:  Regular rate and rhythm, pedal pulses 2+ BL Pulm:  Normal effort Abd: soft/protuberant, minimal distention  incision C/D/I, ostomy appliace recently emptied - stoma appears edematous and viable. +Gas and stool in appliance Skin: warm and dry, no rashes  Psych: A&Ox3   Lab Results:  Recent Labs    06/05/23 0500  WBC 5.5  HGB 13.7  HCT 43.2  PLT 306   BMET Recent Labs    06/05/23 0500 06/06/23 0756  NA 136 138  K 3.2* 3.2*  CL 104 102  CO2 23 25  GLUCOSE 99 121*  BUN 21 23  CREATININE 0.82 0.74  CALCIUM 8.6* 8.8*   PT/INR No results for input(s): "LABPROT", "INR" in the last 72 hours. CMP     Component Value Date/Time   NA 138 06/06/2023 0756   K 3.2 (L) 06/06/2023 0756   CL 102 06/06/2023 0756   CO2 25 06/06/2023 0756   GLUCOSE 121 (H) 06/06/2023 0756   BUN 23 06/06/2023 0756   CREATININE 0.74 06/06/2023 0756   CALCIUM 8.8 (L) 06/06/2023 0756   PROT 9.0 (H) 06/01/2023 1800   ALBUMIN 3.8 06/01/2023 1800   AST 23 06/01/2023 1800   ALT 17 06/01/2023 1800   ALKPHOS 65 06/01/2023 1800   BILITOT 1.1 06/01/2023 1800   GFRNONAA >60 06/06/2023 0756   Lipase     Component Value Date/Time   LIPASE 25 06/01/2023 1800       Studies/Results: X-ray abdomen AP Result Date: 06/04/2023 CLINICAL DATA:  NG tube placement. EXAM:  ABDOMEN - 1 VIEW COMPARISON:  CT 06/01/2023 FINDINGS: Tip and side port of the enteric tube below the diaphragm in the stomach. Dilated loop of bowel in the mid upper abdomen likely represents transverse colon when compared with prior CT. IMPRESSION: Tip and side port of the enteric tube below the diaphragm in the stomach. Electronically Signed   By: Narda Rutherford M.D.   On: 06/04/2023 16:09    Anti-infectives: Anti-infectives (From admission, onward)    Start     Dose/Rate Route Frequency Ordered Stop   06/04/23 0930  cefoTEtan (CEFOTAN) 2 g in sodium chloride 0.9 % 100 mL IVPB       Note to Pharmacy: Pharmacy may adjust dose strength for optimal dosing.   Send with patient on call to the OR.  Anesthesia to complete antibiotic administration <51min prior to incision per [facility].   2 g 200 mL/hr over 30 Minutes Intravenous On call to O.R. 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 Cliffton Asters  - POD#2, AFVSS, hgb stable 13.7 (14) - having bowel function via colostomy - advance to soft diet - WOC RN following for ostomy education - add toradol for pain, increase robaxin dose - D/C Foley   LOS: 5 days   Marin Olp, MD [facility], [facility]

## 2023-06-06 NOTE — Progress Notes (Signed)
 PROGRESS NOTE    [name]  QVZ:[id number] DOB: 1950/05/09 DOA: 06/01/2023 PCP: [facility] [place]   Brief Narrative:  Wayne Lowe is a 73 y.o. male with medical history significant of hypertension, hyperlipidemia, diverticulitis, anxiety, depression, BPH, GERD presented to the ED for evaluation of abdominal pain/distention, nausea, vomiting, and constipation that is been going on for about a week.  Upon arrival to ED, he was hemodynamically stable.  CT abdomen showed colonic dilatation with obstruction, possible diverticulitis with suspicion of mass/malignancy as well as possibility of colovesical fistula.  Patient admitted under hospitalist service, GI consulted, GI recommended holding off antibiotics.  Assessment & Plan:   Principal Problem:   Bowel obstruction (HCC) Active Problems:   Colovesical fistula   Rib fractures   Hypokalemia   QT prolongation   Essential hypertension  Sigmoid colon obstruction / Diverticulitis versus possible malignant obstruction CT showing diffuse colonic dilation with obstruction at the level of the sigmoid colon and there is mild adjacent fat stranding which could be secondary to ongoing diverticulitis, however, a malignant obstruction needs to be excluded by endoscopy.  Borderline leukocytosis on labs, no fever or signs of sepsis.  [facility] by [facility], underwent flexible sigmoidoscopy 06/03/23 which demonstrated a intrinsic stricture of the sigmoid colon that was unable to be traversed. There is no evident mass within his colon.  Per GI recommendations, general surgery consulted and patient underwent Exploratory laparotomy with sigmoid colectomy and end colostomy (Hartmann's procedure) by Dr. Cliffton Asters 06/04/2023,  tolerating full liquid diet, appreciate general surgery help and management per them.    Delirium: Reports of patient being confused and agitated overnight and required hand mittens.  However this morning, he is fully alert and  oriented.  He believes that was secondary to opioid.  He would like to avoid that.  Delirium has resolved now.  Colovesical fistula CT showing a soft tissue tract extending from the sigmoid colon to the bladder suspicious for early fistula development and cystitis.  However, UA is not suggestive of infection.  Per general surgery.   Right 9th and 10th rib fractures Discussed with the patient and fractures appear to be subacute as he reports slipping on ice and falling 6 weeks ago.  Initially had pain at that time but no longer having any rib pain or discomfort now.   Right hepatic lobe lesion CT showing a stable 1.5 cm hyperenhancing lesion in the right hepatic lobe favored to be a hemangioma per radiologist.  However, definitive diagnosis would require multiphasic CT or MRI.  GI consulted.   Mild hypokalemia: Replenished yesterday but still low, will replenish again.  QT prolongation QTc 521 on EKG.  Monitor potassium and magnesium levels, continue to replace as needed.  Avoid QT prolonging drugs and follow-up repeat EKG in the morning.   Mild metabolic acidosis Resolved with IV fluids.   Hypertension Blood pressure rising.  Now that he is able to take p.o. meds, will resume home dose of Proscar as well as amlodipine.  IV hydralazine PRN SBP >160.  Acute urinary retention: Likely due to obstruction, Foley placed on 06/04/2023.  Will resume Proscar.  Plan to remove Foley once he is more mobile.  DVT prophylaxis: SCDs Start: 06/01/23 2334   Code Status: Full Code  Family Communication: None present at bedside.  Plan of care discussed with patient in length and he/she verbalized understanding and agreed with it.  Status is: Inpatient Remains inpatient appropriate because: Recovering from laparotomy.  Will  need slow advancement of the diet.  Will be discharged when cleared by general surgery.  Estimated body mass index is 28.81 kg/m as calculated from the following:   Height as of this  encounter: 5\' 9"  (1.753 m).   Weight as of this encounter: 88.5 kg.    Nutritional Assessment: Body mass index is 28.81 kg/m.Marland Kitchen Seen by dietician.  I agree with the assessment and plan as outlined below: Nutrition Status:        . Skin Assessment: I have examined the patient's skin and I agree with the wound assessment as performed by the wound care RN as outlined below:    Consultants:  GI/general surgery  Procedures:  As above  Antimicrobials:  Anti-infectives (From admission, onward)    Start     Dose/Rate Route Frequency Ordered Stop   06/04/23 0930  cefoTEtan (CEFOTAN) 2 g in sodium chloride 0.9 % 100 mL IVPB       Note to Pharmacy: Pharmacy may adjust dose strength for optimal dosing.   Send with patient on call to the OR.  Anesthesia to complete antibiotic administration <58min prior to incision per [facility].   2 g 200 mL/hr over 30 Minutes Intravenous On call to O.R. 06/04/23 2956 06/04/23 1006   06/04/23 0927  sodium chloride 0.9 % with cefoTEtan (CEFOTAN) ADS Med       Note to Pharmacy: Raelyn Number J: cabinet override      06/04/23 0927 06/04/23 1001         Subjective: Patient seen and examined. Still with abdominal pain and passing flatus.  No other complaint.  Objective: Vitals:   06/05/23 2230 06/05/23 2352 06/06/23 0205 06/06/23 0626  BP: (!) 184/94 (!) 190/94 (!) 172/83 (!) 161/96  Pulse: 94 94 74 75  Resp: 16  18 18   Temp: 98.1 F (36.7 C)  98.2 F (36.8 C) 98 F (36.7 C)  TempSrc:      SpO2: 94%  96% 95%  Weight:      Height:        Intake/Output Summary (Last 24 hours) at 06/06/2023 0744 Last data filed at 06/06/2023 2130 Gross per 24 hour  Intake 240 ml  Output 1975 ml  Net -1735 ml   Filed Weights   06/01/23 1333 06/03/23 1206 06/04/23 0822  Weight: 88.5 kg 88.5 kg 88.5 kg    Examination:  General exam: Appears calm and comfortable  Respiratory system: Clear to auscultation. Respiratory effort normal. Cardiovascular  system: S1 & S2 heard, RRR. No JVD, murmurs, rubs, gallops or clicks. No pedal edema. Gastrointestinal system: Abdomen is soft, very distended, generalized tender, ostomy bag. No organomegaly or masses felt. Normal bowel sounds heard. Central nervous system: Alert and oriented. No focal neurological deficits. Extremities: Symmetric 5 x 5 power. Skin: No rashes, lesions or ulcers.  Psychiatry: Judgement and insight appear normal. Mood & affect appropriate.   Data Reviewed: I have personally reviewed following labs and imaging studies  CBC: Recent Labs  Lab 06/01/23 1800 06/02/23 0623 06/03/23 0613 06/05/23 0500  WBC 11.4* 10.9* 10.2 5.5  NEUTROABS 9.6*  --  8.4* 4.1  HGB 14.9 13.9 14.0 13.7  HCT 46.4 44.0 43.5 43.2  MCV 91.2 93.2 92.8 92.9  PLT 382 342 342 306   Basic Metabolic Panel: Recent Labs  Lab 06/01/23 1800 06/02/23 0623 06/03/23 0613 06/05/23 0500  NA 137 140 139 136  K 3.2* 3.9 4.1 3.2*  CL 103 110 108 104  CO2 19* 21* 22  23  GLUCOSE 133* 113* 111* 99  BUN 20 16 15 21   CREATININE 1.02 0.77 0.70 0.82  CALCIUM 9.3 8.8* 9.1 8.6*  MG  --  2.4  --   --    GFR: Estimated Creatinine Clearance: 87 mL/min (by C-G formula based on SCr of 0.82 mg/dL). Liver Function Tests: Recent Labs  Lab 06/01/23 1800  AST 23  ALT 17  ALKPHOS 65  BILITOT 1.1  PROT 9.0*  ALBUMIN 3.8   Recent Labs  Lab 06/01/23 1800  LIPASE 25   No results for input(s): "AMMONIA" in the last 168 hours. Coagulation Profile: No results for input(s): "INR", "PROTIME" in the last 168 hours. Cardiac Enzymes: No results for input(s): "CKTOTAL", "CKMB", "CKMBINDEX", "TROPONINI" in the last 168 hours. BNP (last 3 results) No results for input(s): "PROBNP" in the last 8760 hours. HbA1C: No results for input(s): "HGBA1C" in the last 72 hours. CBG: No results for input(s): "GLUCAP" in the last 168 hours. Lipid Profile: No results for input(s): "CHOL", "HDL", "LDLCALC", "TRIG", "CHOLHDL",  "LDLDIRECT" in the last 72 hours. Thyroid Function Tests: No results for input(s): "TSH", "T4TOTAL", "FREET4", "T3FREE", "THYROIDAB" in the last 72 hours. Anemia Panel: No results for input(s): "VITAMINB12", "FOLATE", "FERRITIN", "TIBC", "IRON", "RETICCTPCT" in the last 72 hours. Sepsis Labs: No results for input(s): "PROCALCITON", "LATICACIDVEN" in the last 168 hours.  No results found for this or any previous visit (from the past 240 hours).   Radiology Studies: X-ray abdomen AP Result Date: 06/04/2023 CLINICAL DATA:  NG tube placement. EXAM: ABDOMEN - 1 VIEW COMPARISON:  CT 06/01/2023 FINDINGS: Tip and side port of the enteric tube below the diaphragm in the stomach. Dilated loop of bowel in the mid upper abdomen likely represents transverse colon when compared with prior CT. IMPRESSION: Tip and side port of the enteric tube below the diaphragm in the stomach. Electronically Signed   By: Narda Rutherford M.D.   On: 06/04/2023 16:09     Scheduled Meds:  amLODipine  5 mg Oral Daily   Chlorhexidine Gluconate Cloth  6 each Topical Daily   feeding supplement  1 Container Oral TID BM   finasteride  5 mg Oral Daily   FLUoxetine  80 mg Oral [id number]   ketorolac  15 mg Intravenous Q6H   lidocaine  1 patch Transdermal Q24H   methocarbamol (ROBAXIN) injection  1,000 mg Intravenous Q8H   traZODone  100 mg Oral QHS   Continuous Infusions:     LOS: 5 days   Hughie Closs, MD [facility]  06/06/2023, 7:44 AM   *Please note that this is a verbal dictation therefore any spelling or grammatical errors are due to the "Dragon Medical One" system interpretation.  Please page via Amion and do not message via secure chat for urgent patient care matters. Secure chat can be used for non urgent patient care matters.  How to contact the [facility] Attending or Consulting provider 7A - 7P or covering provider during after hours 7P -7A, for this patient?  Check the care team in [facility] and look for a)  attending/consulting TRH provider listed and b) the [facility] team listed. Page or secure chat 7A-7P. Log into [email or web address] and use [place]'s universal password to access. If you do not have the password, please contact the hospital operator. Locate the [facility] provider you are looking for under [facility] and page to a number that you can be directly reached. If you still have difficulty reaching the provider, please page the  DOC (Director on Call) for the Hospitalists listed on amion for assistance.

## 2023-06-07 DIAGNOSIS — K56609 Unspecified intestinal obstruction, unspecified as to partial versus complete obstruction: Secondary | ICD-10-CM | POA: Diagnosis not present

## 2023-06-07 LAB — BASIC METABOLIC PANEL
Anion gap: 12 (ref 5–15)
BUN: 47 mg/dL — ABNORMAL HIGH (ref 8–23)
CO2: 24 mmol/L (ref 22–32)
Calcium: 8.8 mg/dL — ABNORMAL LOW (ref 8.9–10.3)
Chloride: 100 mmol/L (ref 98–111)
Creatinine, Ser: 0.76 mg/dL (ref 0.61–1.24)
GFR, Estimated: >60 mL/min (ref >60)
Glucose, Bld: 107 mg/dL — ABNORMAL HIGH (ref 70–99)
Potassium: 3.2 mmol/L — ABNORMAL LOW (ref 3.5–5.1)
Sodium: 136 mmol/L (ref 135–145)

## 2023-06-07 LAB — CBC WITH DIFFERENTIAL/PLATELET
Abs Immature Granulocytes: 0.05 10*3/uL (ref 0.00–0.07)
Basophils Absolute: 0 10*3/uL (ref 0.0–0.1)
Basophils Relative: 1 %
Eosinophils Absolute: 0.1 10*3/uL (ref 0.0–0.5)
Eosinophils Relative: 1 %
HCT: 41.3 % (ref 39.0–52.0)
Hemoglobin: 13.4 g/dL (ref 13.0–17.0)
Immature Granulocytes: 1 %
Lymphocytes Relative: 10 %
Lymphs Abs: 0.6 10*3/uL — ABNORMAL LOW (ref 0.7–4.0)
MCH: 29.4 pg (ref 26.0–34.0)
MCHC: 32.4 g/dL (ref 30.0–36.0)
MCV: 90.6 fL (ref 80.0–100.0)
Monocytes Absolute: 0.9 10*3/uL (ref 0.1–1.0)
Monocytes Relative: 15 %
Neutro Abs: 4.3 10*3/uL (ref 1.7–7.7)
Neutrophils Relative %: 72 %
Platelets: 347 10*3/uL (ref 150–400)
RBC: 4.56 MIL/uL (ref 4.22–5.81)
RDW: 14.2 % (ref 11.5–15.5)
Smear Review: NORMAL
WBC: 5.9 10*3/uL (ref 4.0–10.5)
nRBC: 0.5 % — ABNORMAL HIGH (ref 0.0–0.2)

## 2023-06-07 NOTE — Discharge Instructions (Signed)

## 2023-06-07 NOTE — Progress Notes (Signed)
 PROGRESS NOTE    Wayne Lowe  ZOX:[id number] DOB: 06-09-1950 DOA: 06/01/2023 PCP: [facility] [place]   Brief Narrative:  Wayne Lowe is a 73 y.o. male with medical history significant of hypertension, hyperlipidemia, diverticulitis, anxiety, depression, BPH, GERD presented to the ED for evaluation of abdominal pain/distention, nausea, vomiting, and constipation that is been going on for about a week.  Upon arrival to ED, he was hemodynamically stable.  CT abdomen showed colonic dilatation with obstruction, possible diverticulitis with suspicion of mass/malignancy as well as possibility of colovesical fistula.  Patient admitted under hospitalist service, GI consulted, GI recommended holding off antibiotics.  Assessment & Plan:   Principal Problem:   Bowel obstruction (HCC) Active Problems:   Colovesical fistula   Rib fractures   Hypokalemia   QT prolongation   Essential hypertension  Sigmoid colon obstruction / Diverticulitis versus possible malignant obstruction CT showing diffuse colonic dilation with obstruction at the level of the sigmoid colon and there is mild adjacent fat stranding which could be secondary to ongoing diverticulitis, however, a malignant obstruction needs to be excluded by endoscopy.  Borderline leukocytosis on labs, no fever or signs of sepsis.  [facility]en by [facility], underwent flexible sigmoidoscopy 06/03/23 which demonstrated an intrinsic stricture of the sigmoid colon that was unable to be traversed. There is no evident mass within his colon.  Per GI recommendations, general surgery consulted and patient underwent Exploratory laparotomy with sigmoid colectomy and end colostomy (Hartmann's procedure) by Dr. Cliffton Asters 06/04/2023, was tolerating soft diet but this morning he says he is nauseous and has vomited x 1, appreciate general surgery help and management per them.  Will defer to general surgery for the diet.  Delirium: Reports of patient being  confused and agitated overnight on 06/05/2023 and required hand mittens.  However patient was fully alert and oriented next morning.  Patient blamed that he was confused because of opioid pain medications.  Delirium resolved.    Colovesical fistula CT showing a soft tissue tract extending from the sigmoid colon to the bladder suspicious for early fistula development and cystitis.  However, UA is not suggestive of infection.  Per general surgery.   Right 9th and 10th rib fractures Discussed with the patient and fractures appear to be subacute as he reports slipping on ice and falling 6 weeks ago.  Initially had pain at that time but no longer having any rib pain or discomfort now.   Right hepatic lobe lesion CT showing a stable 1.5 cm hyperenhancing lesion in the right hepatic lobe favored to be a hemangioma per radiologist.  However, definitive diagnosis would require multiphasic CT or MRI.  GI consulted.   Mild hypokalemia: Replenished yesterday but still low, will replenish again.  QT prolongation QTc 521 on EKG.  Monitor potassium and magnesium levels, continue to replace as needed.  Avoid QT prolonging drugs and follow-up repeat EKG in the morning.   Mild metabolic acidosis Resolved with IV fluids.   Hypertension Blood pressure rising.  Now that he is able to take p.o. meds, will resume home dose of Proscar as well as amlodipine.  IV hydralazine PRN SBP >160.  Acute urinary retention: Likely due to obstruction, Foley placed on 06/04/2023.  Continue Proscar.  Foley removed 06/06/2023, has voided multiple times.   DVT prophylaxis: SCDs Start: 06/01/23 2334   Code Status: Full Code  Family Communication: None present at bedside.  Plan of care discussed with patient in length and he/she verbalized understanding and  agreed with it.  Status is: Inpatient Remains inpatient appropriate because: Recovering from laparotomy.  Will need slow advancement of the diet.  Will be discharged when cleared by  general surgery.  Estimated body mass index is 28.81 kg/m as calculated from the following:   Height as of this encounter: 5\' 9"  (1.753 m).   Weight as of this encounter: 88.5 kg.    Nutritional Assessment: Body mass index is 28.81 kg/m.Marland Kitchen Seen by dietician.  I agree with the assessment and plan as outlined below: Nutrition Status:        . Skin Assessment: I have examined the patient's skin and I agree with the wound assessment as performed by the wound care RN as outlined below:    Consultants:  GI/general surgery  Procedures:  As above  Antimicrobials:  Anti-infectives (From admission, onward)    Start     Dose/Rate Route Frequency Ordered Stop   06/04/23 0930  cefoTEtan (CEFOTAN) 2 g in sodium chloride 0.9 % 100 mL IVPB       Note to Pharmacy: Pharmacy may adjust dose strength for optimal dosing.   Send with patient on call to the OR.  Anesthesia to complete antibiotic administration <69min prior to incision per [facility].   2 g 200 mL/hr over 30 Minutes Intravenous On call to O.R. 06/04/23 0923 06/04/23 1006   06/04/23 0927  sodium chloride 0.9 % with cefoTEtan (CEFOTAN) ADS Med       Note to Pharmacy: Raelyn Number J: cabinet override      06/04/23 0927 06/04/23 1001         Subjective: Seen and examined, complains of some nausea and vomiting this morning.  Mild abdominal pain.  Objective: Vitals:   06/06/23 1114 06/06/23 1414 06/06/23 2030 06/07/23 0604  BP: (!) 148/93 (!) 154/89 (!) 178/96 128/76  Pulse: 61 81 86 88  Resp:  19 18 18   Temp:   98.2 F (36.8 C) (!) 97.4 F (36.3 C)  TempSrc:      SpO2:  98% 97% 92%  Weight:      Height:        Intake/Output Summary (Last 24 hours) at 06/07/2023 0804 Last data filed at 06/07/2023 0600 Gross per 24 hour  Intake --  Output 1175 ml  Net -1175 ml   Filed Weights   06/01/23 1333 06/03/23 1206 06/04/23 0822  Weight: 88.5 kg 88.5 kg 88.5 kg    Examination:  General exam: Appears calm and  comfortable  Respiratory system: Clear to auscultation. Respiratory effort normal. Cardiovascular system: S1 & S2 heard, RRR. No JVD, murmurs, rubs, gallops or clicks. No pedal edema. Gastrointestinal system: Abdomen is less distended today and softer today but still has mild to moderate generalized tenderness like yesterday.   Central nervous system: Alert and oriented. No focal neurological deficits. Extremities: Symmetric 5 x 5 power. Skin: No rashes, lesions or ulcers.  Psychiatry: Judgement and insight appear normal. Mood & affect appropriate.   Data Reviewed: I have personally reviewed following labs and imaging studies  CBC: Recent Labs  Lab 06/01/23 1800 06/02/23 0623 06/03/23 0613 06/05/23 0500  WBC 11.4* 10.9* 10.2 5.5  NEUTROABS 9.6*  --  8.4* 4.1  HGB 14.9 13.9 14.0 13.7  HCT 46.4 44.0 43.5 43.2  MCV 91.2 93.2 92.8 92.9  PLT 382 342 342 306   Basic Metabolic Panel: Recent Labs  Lab 06/01/23 1800 06/02/23 0623 06/03/23 0613 06/05/23 0500 06/06/23 0756  NA 137 140 139 136 138  K 3.2* 3.9 4.1 3.2* 3.2*  CL 103 110 108 104 102  CO2 19* 21* 22 23 25   GLUCOSE 133* 113* 111* 99 121*  BUN 20 16 15 21 23   CREATININE 1.02 0.77 0.70 0.82 0.74  CALCIUM 9.3 8.8* 9.1 8.6* 8.8*  MG  --  2.4  --   --   --    GFR: Estimated Creatinine Clearance: 89.1 mL/min (by C-G formula based on SCr of 0.74 mg/dL). Liver Function Tests: Recent Labs  Lab 06/01/23 1800  AST 23  ALT 17  ALKPHOS 65  BILITOT 1.1  PROT 9.0*  ALBUMIN 3.8   Recent Labs  Lab 06/01/23 1800  LIPASE 25   No results for input(s): "AMMONIA" in the last 168 hours. Coagulation Profile: No results for input(s): "INR", "PROTIME" in the last 168 hours. Cardiac Enzymes: No results for input(s): "CKTOTAL", "CKMB", "CKMBINDEX", "TROPONINI" in the last 168 hours. BNP (last 3 results) No results for input(s): "PROBNP" in the last 8760 hours. HbA1C: No results for input(s): "HGBA1C" in the last 72  hours. CBG: No results for input(s): "GLUCAP" in the last 168 hours. Lipid Profile: No results for input(s): "CHOL", "HDL", "LDLCALC", "TRIG", "CHOLHDL", "LDLDIRECT" in the last 72 hours. Thyroid Function Tests: No results for input(s): "TSH", "T4TOTAL", "FREET4", "T3FREE", "THYROIDAB" in the last 72 hours. Anemia Panel: No results for input(s): "VITAMINB12", "FOLATE", "FERRITIN", "TIBC", "IRON", "RETICCTPCT" in the last 72 hours. Sepsis Labs: No results for input(s): "PROCALCITON", "LATICACIDVEN" in the last 168 hours.  No results found for this or any previous visit (from the past 240 hours).   Radiology Studies: No results found.    Scheduled Meds:  amLODipine  5 mg Oral Daily   Chlorhexidine Gluconate Cloth  6 each Topical Daily   feeding supplement  1 Container Oral TID BM   finasteride  5 mg Oral Daily   FLUoxetine  80 mg Oral BH-q7a   ketorolac  15 mg Intravenous Q6H   lidocaine  1 patch Transdermal Q24H   methocarbamol (ROBAXIN) injection  1,000 mg Intravenous Q8H   tamsulosin  0.4 mg Oral Daily   traZODone  100 mg Oral QHS   Continuous Infusions:     LOS: 6 days   Hughie Closs, MD [facility]  06/07/2023, 8:04 AM   *Please note that this is a verbal dictation therefore any spelling or grammatical errors are due to the "Dragon Medical One" system interpretation.  Please page via Amion and do not message via secure chat for urgent patient care matters. Secure chat can be used for non urgent patient care matters.  How to contact the [facility] Attending or Consulting provider 7A - 7P or covering provider during after hours 7P -7A, for this patient?  Check the care team in [facility] and look for a) attending/consulting TRH provider listed and b) the [facility] team listed. Page or secure chat 7A-7P. Log into [email or web address] and use [place]'s universal password to access. If you do not have the password, please contact the hospital operator. Locate the [facility] provider you are  looking for under [facility] and page to a number that you can be directly reached. If you still have difficulty reaching the provider, please page the [facility] (Director on Call) for the Hospitalists listed on amion for assistance.

## 2023-06-07 NOTE — Progress Notes (Addendum)
 Occupational Therapy Treatment Patient Details Name: Wayne Lowe MRN: [id number] DOB: 11/17/1950 Today's Date: 06/07/2023   History of present illness 73 yo male admitted with bowel obstruction. 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 Pt also found to have some rib fxs on imaging. Hx of COVID, anxiety, dizziness, tremor, LBP, RLS, memory changes.   OT comments  Pt. Seen for skilled OT treatment session.  Session limited secondary to pt. Reports of fatigue.  Focused on bed mobility in prep for increased mobility and participation in ADLs.  Pt. Wanting therapist Asst. Support for supine to sit.  Educated and attempted log roll for incision comfort.  Pt. Able to come into side lying but states he could not push through elbow into sitting.  Declined trying to exit opposite side for improved comfort attempts.  Able to come partially into sitting via his method of UE support and back support but could not sustain citing fatigue as the reason and laying back on the bed in supine.  Cont. With acute OT POC an progress as pt. Able to tolerate.        If plan is discharge home, recommend the following:  A little help with bathing/dressing/bathroom;A little help with walking and/or transfers;Assist for transportation;Help with stairs or ramp for entrance   Equipment Recommendations  Other (comment)    Recommendations for Other Services      Precautions / Restrictions Precautions Precautions: Fall Precaution/Restrictions Comments: L colostmy       Mobility Bed Mobility Overal bed mobility: Needs Assistance Bed Mobility: Supine to Sit, Sit to Supine, Sit to Sidelying, Sidelying to Sit   Sidelying to sit: Max assist Supine to sit: Min assist, HOB elevated Sit to supine: Contact guard assist   General bed mobility comments: attempted log roll for incision comfort during bed mobility and decreasing abdomen pain. pt. unable to fully attempt/achieve sidelying stating "i cant i cant" wanting  to have me pull him with my UE while he held my hand "how me and my wife have been doing it".  education on not pulling on each other for safety.  pt. able to bend knees and roll into sidelying but would not push through elbow to come into sitting on L side.  offered to try R side instead and he declined.  assisted into sitting the way he and his wife do with me assiting with ue and trunk support. once in sitting he states "im sorry i just cant" and laid back down into supine. stating fatigue    Transfers                         Balance                                           ADL either performed or assessed with clinical judgement   ADL Overall ADL's : Needs assistance/impaired                                       General ADL Comments: focused on be mobility in prep for increasing mobility    Extremity/Trunk Assessment              Vision       Perception     Praxis  Communication Communication Communication: No apparent difficulties   Cognition Arousal: Alert Behavior During Therapy: WFL for tasks assessed/performed Cognition: No apparent impairments                               Following commands: Intact        Cueing   Cueing Techniques: Verbal cues  Exercises      Shoulder Instructions       General Comments      Pertinent Vitals/ Pain       Pain Assessment Pain Assessment: Faces Faces Pain Scale: Hurts little more Pain Location: abdomen Pain Descriptors / Indicators: Operative site guarding Pain Intervention(s): Limited activity within patient's tolerance, Repositioned, Monitored during session  Home Living                                          Prior Functioning/Environment              Frequency  Min 2X/week        Progress Toward Goals  OT Goals(current goals can now be found in the care plan section)  Progress towards OT goals: Progressing  toward goals     Plan      Co-evaluation                 AM-PAC OT "6 Clicks" Daily Activity     Outcome Measure   Help from another person eating meals?: None Help from another person taking care of personal grooming?: A Little Help from another person toileting, which includes using toliet, bedpan, or urinal?: A Little Help from another person bathing (including washing, rinsing, drying)?: A Little Help from another person to put on and taking off regular upper body clothing?: A Little Help from another person to put on and taking off regular lower body clothing?: A Little 6 Click Score: 19    End of Session    OT Visit Diagnosis: Unsteadiness on feet (R26.81);Pain   Activity Tolerance Patient limited by fatigue   Patient Left in bed;with call bell/phone within reach;with bed alarm set;Other (comment) (3 rails vs 4 that were up originally upon my arrival)   Nurse Communication Other (comment);Mobility status (rn states ok to work with pt. reports fluctuations of physical abilities throughout the day.  reviewed session details of pt. returning to supine stating he could not participate anymore)        Time: 4401-0272 OT Time Calculation (min): 8 min  Charges: OT General Charges $OT Visit: 1 Visit OT Treatments $Therapeutic Activity: 8-22 mins  Boneta Lucks, COTA/L Acute Rehabilitation [phone]   Alessandra Bevels Lorraine-COTA/L 06/07/2023, 1:27 PM

## 2023-06-07 NOTE — Consult Note (Signed)
 WOC Nurse ostomy follow up Stoma type/location: LUQ, end colostomy Stomal assessment/size:  1 1/2" x 1 3/4" oval shaped stoma, necrotic at mucocutaneous junction today from 7 o'clock to 5 o'clock  Peristomal assessment:  intact  Treatment options for stomal/peristomal skin: 2" ostomy barrier ring  Output liquid brown, appears to be blood tinged  Ostomy pouching: 1pc.soft convex, 2" barrier ring  Education provided:  Met with patient and his wife.  He will not participate in care, will rely on wife to provide care.  She did very well today.   Explained stoma characteristics (budded, flush, color, texture, care) Wife emptied pouch and cleansed spout with wick of toilet paper.  Allowed wife to cut new skin barrier, removed old pouch, cleansed patient's stoma/skin.  We discussed the change in the color of the mucosa and that it will most likely slough off. She applied new barrier ring and applied new pouch.  Education on emptying when 1/3 to 1/2 full and how to empty Demonstrated use of wick to clean spout and wife performed Provided patient with [employer] and marked items currently using Answered patient/family questions related to supplies. Reviewed educational materials with wife.  5 extra soft convex pouches and 5 barrier rings in the patient's room for use/discharge    Enrolled patient in [place] Secure Start Discharge program: Yes   WOC Nurse will follow along with you for continued support with ostomy teaching and care Carolin Quang [facility] MSN, RN, [place], CNS, [place] [phone]

## 2023-06-07 NOTE — TOC CM/SW Note (Signed)

## 2023-06-07 NOTE — Progress Notes (Signed)
 [facility] Progress Note  3 Days Post-Op  Subjective: CC:  Feeling well, tolerating diet.  Ostomy working nicely  Objective: Vital signs in last 24 hours: Temp:  [97.4 F (36.3 C)-98.2 F (36.8 C)] 97.4 F (36.3 C) (03/07 0604) Pulse Rate:  [61-88] 88 (03/07 0604) Resp:  [18-19] 18 (03/07 0604) BP: (128-178)/(76-96) 128/76 (03/07 0604) SpO2:  [92 %-98 %] 92 % (03/07 0604) Last BM Date : 06/06/23  Intake/Output from previous day: 03/06 0701 - 03/07 0700 In: -  Out: 1175 [Stool:1175] Intake/Output this shift: No intake/output data recorded.  PE: Gen:  Alert, NAD, pleasant Pulm:  Normal effort Abd: soft/protuberant, minimal distention  incision C/D/I, ostomy appliace recently emptied - stoma appears edematous and viable. +Gas and liquid stool in appliance Psych: A&Ox3   Lab Results:  Recent Labs    06/05/23 0500 06/07/23 0844  WBC 5.5 5.9  HGB 13.7 13.4  HCT 43.2 41.3  PLT 306 347   BMET Recent Labs    06/06/23 0756 06/07/23 0844  NA 138 136  K 3.2* 3.2*  CL 102 100  CO2 25 24  GLUCOSE 121* 107*  BUN 23 47*  CREATININE 0.74 0.76  CALCIUM 8.8* 8.8*   PT/INR No results for input(s): "LABPROT", "INR" in the last 72 hours. CMP     Component Value Date/Time   NA 136 06/07/2023 0844   K 3.2 (L) 06/07/2023 0844   CL 100 06/07/2023 0844   CO2 24 06/07/2023 0844   GLUCOSE 107 (H) 06/07/2023 0844   BUN 47 (H) 06/07/2023 0844   CREATININE 0.76 06/07/2023 0844   CALCIUM 8.8 (L) 06/07/2023 0844   PROT 9.0 (H) 06/01/2023 1800   ALBUMIN 3.8 06/01/2023 1800   AST 23 06/01/2023 1800   ALT 17 06/01/2023 1800   ALKPHOS 65 06/01/2023 1800   BILITOT 1.1 06/01/2023 1800   GFRNONAA >60 06/07/2023 0844   Lipase     Component Value Date/Time   LIPASE 25 06/01/2023 1800       Studies/Results: No results found.   Anti-infectives: Anti-infectives (From admission, onward)    Start     Dose/Rate Route Frequency Ordered Stop   06/04/23 0930   cefoTEtan (CEFOTAN) 2 g in sodium chloride 0.9 % 100 mL IVPB       Note to Pharmacy: Pharmacy may adjust dose strength for optimal dosing.   Send with patient on call to the OR.  Anesthesia to complete antibiotic administration <6min prior to incision per [facility].   2 g 200 mL/hr over 30 Minutes Intravenous On call to O.R. 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 Cliffton Asters  POD#3  AFVSS, hgb stable at 13.4  - having bowel function via colostomy - soft diet - WOC RN following for ostomy education - continue toradol for pain, robaxin  - PT/OT  - Dispo per primary   LOS: 6 days   Marin Olp, MD [facility]

## 2023-06-07 NOTE — TOC Progression Note (Signed)
 Transition of Care [facility]) - Progression Note    Patient Details  Name: Wayne Lowe MRN: [id number] Date of Birth: 30-Dec-1950  [facility]) CM/SW Contact  Otelia Santee, LCSW Phone Number: 06/07/2023, 3:26 PM  Clinical Narrative:    Pt and spouse agreeable to [facility] being arranged. HHPT has been arranged with [facility]. HH order will need to be placed prior to discharge. Pt's spouse asked about getting a different shower seat for pt. CSW discussed having to private pay for this and placed supply company information on AVS for pt to follow up with.         Expected Discharge Plan and Services                         DME Arranged: N/A DME Agency: NA       HH Arranged: PT HH Agency: [facility] Date [facility] Agency Contacted: 06/07/23 Time HH Agency Contacted: 1525 Representative spoke with at [facility] Agency: Cindie   Social Determinants of Health (SDOH) Interventions SDOH Screenings   Food Insecurity: No Food Insecurity (06/02/2023)  Housing: Low Risk  (06/02/2023)  Transportation Needs: No Transportation Needs (06/02/2023)  Utilities: Not At Risk (06/02/2023)  Social Connections: Moderately Integrated (06/02/2023)  Tobacco Use: Medium Risk (06/04/2023)    Readmission Risk Interventions    06/03/2023    3:11 PM  Readmission Risk Prevention Plan  Post Dischage Appt Complete  Medication Screening Complete  Transportation Screening Complete

## 2023-06-08 DIAGNOSIS — K56609 Unspecified intestinal obstruction, unspecified as to partial versus complete obstruction: Secondary | ICD-10-CM | POA: Diagnosis not present

## 2023-06-08 LAB — BASIC METABOLIC PANEL
Anion gap: 10 (ref 5–15)
BUN: 41 mg/dL — ABNORMAL HIGH (ref 8–23)
CO2: 24 mmol/L (ref 22–32)
Calcium: 8.8 mg/dL — ABNORMAL LOW (ref 8.9–10.3)
Chloride: 105 mmol/L (ref 98–111)
Creatinine, Ser: 0.85 mg/dL (ref 0.61–1.24)
GFR, Estimated: >60 mL/min (ref >60)
Glucose, Bld: 111 mg/dL — ABNORMAL HIGH (ref 70–99)
Potassium: 3.7 mmol/L (ref 3.5–5.1)
Sodium: 139 mmol/L (ref 135–145)

## 2023-06-08 MED ORDER — AMLODIPINE BESYLATE 10 MG PO TABS
10.0000 mg | ORAL_TABLET | Freq: Every day | ORAL | Status: DC
  Administered 2023-06-08 – 2023-06-10 (×3): 10 mg via ORAL
  Filled 2023-06-08 (×3): qty 1

## 2023-06-08 NOTE — Progress Notes (Signed)
 4 Days Post-Op   Subjective/Chief Complaint:  Pt doing well   Bowel function returning   Objective: Vital signs in last 24 hours: Temp:  [97.5 F (36.4 C)-98.2 F (36.8 C)] 98.2 F (36.8 C) (03/08 0446) Pulse Rate:  [81-88] 88 (03/08 0446) Resp:  [17-18] 18 (03/08 0446) BP: (148-189)/(86-95) 148/87 (03/08 0446) SpO2:  [95 %-96 %] 96 % (03/08 0446) Last BM Date : 06/07/23  Intake/Output from previous day: 03/07 0701 - 03/08 0700 In: -  Out: 400 [Stool:400] Intake/Output this shift: No intake/output data recorded.   Gen:  Alert, NAD, pleasant Pulm:  Normal effort Abd: soft/protuberant, minimal distention  incision C/D/I, ostomy appliace recently emptied - stoma appears edematous and viable. +Gas and liquid stool in appliance Psych: A&Ox3  Lab Results:  Recent Labs    06/07/23 0844  WBC 5.9  HGB 13.4  HCT 41.3  PLT 347   BMET Recent Labs    06/07/23 0844 06/08/23 0652  NA 136 139  K 3.2* 3.7  CL 100 105  CO2 24 24  GLUCOSE 107* 111*  BUN 47* 41*  CREATININE 0.76 0.85  CALCIUM 8.8* 8.8*   PT/INR No results for input(s): "LABPROT", "INR" in the last 72 hours. ABG No results for input(s): "PHART", "HCO3" in the last 72 hours.  Invalid input(s): "PCO2", "PO2"  Studies/Results: No results found.  Anti-infectives: Anti-infectives (From admission, onward)    Start     Dose/Rate Route Frequency Ordered Stop   06/04/23 0930  cefoTEtan (CEFOTAN) 2 g in sodium chloride 0.9 % 100 mL IVPB       Note to Pharmacy: Pharmacy may adjust dose strength for optimal dosing.   Send with patient on call to the OR.  Anesthesia to complete antibiotic administration <45min prior to incision per [facility].   2 g 200 mL/hr over 30 Minutes Intravenous On call to O.R. 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 Cliffton Asters     POD#4 - doing well       - having bowel function via colostomy - soft diet - WOC RN following for ostomy education - continue toradol for pain, robaxin  - PT/OT  - ok for D/C from medical      LOS: 7 days    Dortha Schwalbe MD  06/08/2023

## 2023-06-08 NOTE — Plan of Care (Signed)
  Problem: Education: Goal: Knowledge of General Education information will improve Description: Including pain rating scale, medication(s)/side effects and non-pharmacologic comfort measures Outcome: Progressing   Problem: Clinical Measurements: Goal: Will remain free from infection Outcome: Progressing   Problem: Clinical Measurements: Goal: Diagnostic test results will improve Outcome: Progressing   Problem: Activity: Goal: Risk for activity intolerance will decrease Outcome: Progressing   

## 2023-06-08 NOTE — Progress Notes (Signed)
 PROGRESS NOTE    Wayne Lowe  ZOX:[id number] DOB: 1950/06/09 DOA: 06/01/2023 PCP: [facility] [place]   Brief Narrative:  Wayne Lowe is a 73 y.o. male with medical history significant of hypertension, hyperlipidemia, diverticulitis, anxiety, depression, BPH, GERD presented to the ED for evaluation of abdominal pain/distention, nausea, vomiting, and constipation that is been going on for about a week.  Upon arrival to ED, he was hemodynamically stable.  CT abdomen showed colonic dilatation with obstruction, possible diverticulitis with suspicion of mass/malignancy as well as possibility of colovesical fistula.  Patient admitted under hospitalist service, GI consulted, GI recommended holding off antibiotics.  Assessment & Plan:   Principal Problem:   Bowel obstruction (HCC) Active Problems:   Colovesical fistula   Rib fractures   Hypokalemia   QT prolongation   Essential hypertension  Sigmoid colon obstruction / Diverticulitis versus possible malignant obstruction CT showing diffuse colonic dilation with obstruction at the level of the sigmoid colon and there is mild adjacent fat stranding which could be secondary to ongoing diverticulitis, however, a malignant obstruction needs to be excluded by endoscopy.  Borderline leukocytosis on labs, no fever or signs of sepsis.  [facility]en by [facility], underwent flexible sigmoidoscopy 06/03/23 which demonstrated an intrinsic stricture of the sigmoid colon that was unable to be traversed. There is no evident mass within his colon.  Per GI recommendations, general surgery consulted and patient underwent Exploratory laparotomy with sigmoid colectomy and end colostomy (Hartmann's procedure) by Dr. Cliffton Asters 06/04/2023, was tolerating soft diet but yesterday he had 1 vomiting, no further nausea or vomiting and he is tolerating soft again.  He still having liquid stools in the ostomy bag.  Awaiting general surgery for further  recommendations.  Delirium: Reports of patient being confused and agitated overnight on 06/05/2023 and required hand mittens.  However patient was fully alert and oriented next morning.  Patient blamed that he was confused because of opioid pain medications.  Delirium resolved.    Colovesical fistula CT showing a soft tissue tract extending from the sigmoid colon to the bladder suspicious for early fistula development and cystitis.  However, UA is not suggestive of infection.  Per general surgery.   Right 9th and 10th rib fractures Discussed with the patient and fractures appear to be subacute as he reports slipping on ice and falling 6 weeks ago.  Initially had pain at that time but no longer having any rib pain or discomfort now.   Right hepatic lobe lesion CT showing a stable 1.5 cm hyperenhancing lesion in the right hepatic lobe favored to be a hemangioma per radiologist.  However, definitive diagnosis would require multiphasic CT or MRI.  GI consulted.   Mild hypokalemia: Replenished yesterday but still low, will replenish again.  QT prolongation QTc 521 on EKG.  Monitor potassium and magnesium levels, continue to replace as needed.  Avoid QT prolonging drugs and follow-up repeat EKG in the morning.   Mild metabolic acidosis Resolved with IV fluids.   Hypertension Blood pressure still slightly elevated, will increase amlodipine to 10 mg.  continue Proscar and IV hydralazine PRN SBP >160.  Acute urinary retention: Likely due to obstruction, Foley placed on 06/04/2023.  Continue Proscar.  Foley removed 06/06/2023, has voided multiple times.   DVT prophylaxis: SCDs Start: 06/01/23 2334   Code Status: Full Code  Family Communication: None present at bedside.  Plan of care discussed with patient in length and he/she verbalized understanding and agreed with it.  Status  is: Inpatient Remains inpatient appropriate because: Recovering from laparotomy.  Will need slow advancement of the diet.   Will be discharged when cleared by general surgery.  Estimated body mass index is 28.81 kg/m as calculated from the following:   Height as of this encounter: 5\' 9"  (1.753 m).   Weight as of this encounter: 88.5 kg.    Nutritional Assessment: Body mass index is 28.81 kg/m.Marland Kitchen Seen by dietician.  I agree with the assessment and plan as outlined below: Nutrition Status:        . Skin Assessment: I have examined the patient's skin and I agree with the wound assessment as performed by the wound care RN as outlined below:    Consultants:  GI/general surgery  Procedures:  As above  Antimicrobials:  Anti-infectives (From admission, onward)    Start     Dose/Rate Route Frequency Ordered Stop   06/04/23 0930  cefoTEtan (CEFOTAN) 2 g in sodium chloride 0.9 % 100 mL IVPB       Note to Pharmacy: Pharmacy may adjust dose strength for optimal dosing.   Send with patient on call to the OR.  Anesthesia to complete antibiotic administration <105min prior to incision per [facility].   2 g 200 mL/hr over 30 Minutes Intravenous On call to O.R. 06/04/23 5284 06/04/23 1006   06/04/23 0927  sodium chloride 0.9 % with cefoTEtan (CEFOTAN) ADS Med       Note to Pharmacy: Raelyn Number J: cabinet override      06/04/23 0927 06/04/23 1001         Subjective: Patient seen and examined.  No new complaint.  Still with abdominal pain which is gradually improving.  No reports of nausea or vomiting.  Objective: Vitals:   06/07/23 1749 06/07/23 1811 06/07/23 2031 06/08/23 0446  BP: (!) 189/86 (!) 167/90 (!) 172/95 (!) 148/87  Pulse: 86 86 81 88  Resp:   17 18  Temp:   (!) 97.5 F (36.4 C) 98.2 F (36.8 C)  TempSrc:   Oral Oral  SpO2: 96%  95% 96%  Weight:      Height:        Intake/Output Summary (Last 24 hours) at 06/08/2023 0657 Last data filed at 06/08/2023 0100 Gross per 24 hour  Intake --  Output 400 ml  Net -400 ml   Filed Weights   06/01/23 1333 06/03/23 1206 06/04/23 0822   Weight: 88.5 kg 88.5 kg 88.5 kg    Examination:  General exam: Appears calm and comfortable  Respiratory system: Clear to auscultation. Respiratory effort normal. Cardiovascular system: S1 & S2 heard, RRR. No JVD, murmurs, rubs, gallops or clicks. No pedal edema. Gastrointestinal system: Abdomen is mildly distended, soft and generalized tender, tenderness is improving.  Has colostomy bag with liquid stool.  No organomegaly or masses felt. Normal bowel sounds heard. Central nervous system: Alert and oriented. No focal neurological deficits. Extremities: Symmetric 5 x 5 power. Skin: No rashes, lesions or ulcers.  Psychiatry: Judgement and insight appear normal. Mood & affect appropriate.   Data Reviewed: I have personally reviewed following labs and imaging studies  CBC: Recent Labs  Lab 06/01/23 1800 06/02/23 0623 06/03/23 0613 06/05/23 0500 06/07/23 0844  WBC 11.4* 10.9* 10.2 5.5 5.9  NEUTROABS 9.6*  --  8.4* 4.1 4.3  HGB 14.9 13.9 14.0 13.7 13.4  HCT 46.4 44.0 43.5 43.2 41.3  MCV 91.2 93.2 92.8 92.9 90.6  PLT 382 342 342 306 347   Basic Metabolic Panel:  Recent Labs  Lab 06/02/23 0623 06/03/23 0613 06/05/23 0500 06/06/23 0756 06/07/23 0844  NA 140 139 136 138 136  K 3.9 4.1 3.2* 3.2* 3.2*  CL 110 108 104 102 100  CO2 21* 22 23 25 24   GLUCOSE 113* 111* 99 121* 107*  BUN 16 15 21 23  47*  CREATININE 0.77 0.70 0.82 0.74 0.76  CALCIUM 8.8* 9.1 8.6* 8.8* 8.8*  MG 2.4  --   --   --   --    GFR: Estimated Creatinine Clearance: 89.1 mL/min (by C-G formula based on SCr of 0.76 mg/dL). Liver Function Tests: Recent Labs  Lab 06/01/23 1800  AST 23  ALT 17  ALKPHOS 65  BILITOT 1.1  PROT 9.0*  ALBUMIN 3.8   Recent Labs  Lab 06/01/23 1800  LIPASE 25   No results for input(s): "AMMONIA" in the last 168 hours. Coagulation Profile: No results for input(s): "INR", "PROTIME" in the last 168 hours. Cardiac Enzymes: No results for input(s): "CKTOTAL", "CKMB",  "CKMBINDEX", "TROPONINI" in the last 168 hours. BNP (last 3 results) No results for input(s): "PROBNP" in the last 8760 hours. HbA1C: No results for input(s): "HGBA1C" in the last 72 hours. CBG: No results for input(s): "GLUCAP" in the last 168 hours. Lipid Profile: No results for input(s): "CHOL", "HDL", "LDLCALC", "TRIG", "CHOLHDL", "LDLDIRECT" in the last 72 hours. Thyroid Function Tests: No results for input(s): "TSH", "T4TOTAL", "FREET4", "T3FREE", "THYROIDAB" in the last 72 hours. Anemia Panel: No results for input(s): "VITAMINB12", "FOLATE", "FERRITIN", "TIBC", "IRON", "RETICCTPCT" in the last 72 hours. Sepsis Labs: No results for input(s): "PROCALCITON", "LATICACIDVEN" in the last 168 hours.  No results found for this or any previous visit (from the past 240 hours).   Radiology Studies: No results found.    Scheduled Meds:  amLODipine  10 mg Oral Daily   Chlorhexidine Gluconate Cloth  6 each Topical Daily   feeding supplement  1 Container Oral TID BM   finasteride  5 mg Oral Daily   FLUoxetine  80 mg Oral BH-q7a   ketorolac  15 mg Intravenous Q6H   lidocaine  1 patch Transdermal Q24H   methocarbamol (ROBAXIN) injection  1,000 mg Intravenous Q8H   tamsulosin  0.4 mg Oral Daily   traZODone  100 mg Oral QHS   Continuous Infusions:     LOS: 7 days   Hughie Closs, MD [facility]  06/08/2023, 6:57 AM   *Please note that this is a verbal dictation therefore any spelling or grammatical errors are due to the "Dragon Medical One" system interpretation.  Please page via Amion and do not message via secure chat for urgent patient care matters. Secure chat can be used for non urgent patient care matters.  How to contact the [facility] Attending or Consulting provider 7A - 7P or covering provider during after hours 7P -7A, for this patient?  Check the care team in [facility] and look for a) attending/consulting [facility] provider listed and b) the [facility] team listed. Page or secure chat  7A-7P. Log into [email or web address] and use [facility] universal password to access. If you do not have the password, please contact the hospital operator. Locate the [facility] provider you are looking for under [facility] and page to a number that you can be directly reached. If you still have difficulty reaching the provider, please page the [facility] (Director on Call) for the Hospitalists listed on amion for assistance.

## 2023-06-08 NOTE — Progress Notes (Signed)
 Physical Therapy Treatment Patient Details Name: Wayne Lowe MRN: [id number] DOB: 01/02/1951 Today's Date: 06/08/2023   History of Present Illness 73 yo male admitted with bowel obstruction. 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 Pt also found to have some rib fxs on imaging. Hx of COVID, anxiety, dizziness, tremor, LBP, RLS, memory changes.    PT Comments  Pt agreeable to working with therapy. Pt is progressing well. Tolerated activity well.     If plan is discharge home, recommend the following: A little help with walking and/or transfers;A little help with bathing/dressing/bathroom;Assistance with cooking/housework;Assist for transportation;Help with stairs or ramp for entrance   Can travel by private vehicle        Equipment Recommendations  None recommended by PT    Recommendations for Other Services       Precautions / Restrictions Precautions Precautions: Fall Precaution/Restrictions Comments: L colostomy Restrictions Weight Bearing Restrictions Per Provider Order: No     Mobility  Bed Mobility Overal bed mobility: Needs Assistance Bed Mobility: Supine to Sit, Sit to Supine     Supine to sit: Supervision, HOB elevated Sit to supine: Supervision, HOB elevated   General bed mobility comments: Supv for lines. No physical assist required.    Transfers Overall transfer level: Needs assistance Equipment used: Rolling walker (2 wheels) Transfers: Sit to/from Stand Sit to Stand: Supervision           General transfer comment: Cues for safety    Ambulation/Gait Ambulation/Gait assistance: Contact guard assist Gait Distance (Feet): 250 Feet Assistive device: Rolling walker (2 wheels) Gait Pattern/deviations: Step-through pattern, Decreased stride length       General Gait Details: Cues for safety. Good gait speed. Can likely attempt ambulation without device next session   Stairs             Wheelchair Mobility     Tilt Bed     Modified Rankin (Stroke Patients Only)       Balance Overall balance assessment: Mild deficits observed, not formally tested                                          Communication Communication Communication: No apparent difficulties  Cognition Arousal: Alert Behavior During Therapy: WFL for tasks assessed/performed   PT - Cognitive impairments: No apparent impairments                         Following commands: Intact      Cueing Cueing Techniques: Verbal cues  Exercises      General Comments        Pertinent Vitals/Pain Pain Assessment Pain Assessment: Faces Faces Pain Scale: Hurts little more Pain Location: abdomen Pain Descriptors / Indicators: Discomfort Pain Intervention(s): Monitored during session, Repositioned    Home Living                          Prior Function            PT Goals (current goals can now be found in the care plan section) Progress towards PT goals: Progressing toward goals    Frequency    Min 2X/week      PT Plan      Co-evaluation              AM-PAC PT "6 Clicks" Mobility   Outcome Measure  Help needed turning from your back to your side while in a flat bed without using bedrails?: None Help needed moving from lying on your back to sitting on the side of a flat bed without using bedrails?: None Help needed moving to and from a bed to a chair (including a wheelchair)?: None Help needed standing up from a chair using your arms (e.g., wheelchair or bedside chair)?: None Help needed to walk in hospital room?: A Little Help needed climbing 3-5 steps with a railing? : A Little 6 Click Score: 22    End of Session Equipment Utilized During Treatment: Gait belt Activity Tolerance: Patient tolerated treatment well Patient left: in bed;with call bell/phone within reach;with bed alarm set   PT Visit Diagnosis: Unsteadiness on feet (R26.81);Muscle weakness (generalized)  (M62.81);Pain     Time: 1610-9604 PT Time Calculation (min) (ACUTE ONLY): 18 min  Charges:    $Gait Training: 8-22 mins PT General Charges $$ ACUTE PT VISIT: 1 Visit                         Faye Ramsay, PT Acute Rehabilitation  Office: [phone]

## 2023-06-09 DIAGNOSIS — K56609 Unspecified intestinal obstruction, unspecified as to partial versus complete obstruction: Secondary | ICD-10-CM | POA: Diagnosis not present

## 2023-06-09 MED ORDER — OXYCODONE HCL 5 MG PO TABS
5.0000 mg | ORAL_TABLET | ORAL | Status: DC | PRN
  Administered 2023-06-10: 5 mg via ORAL
  Filled 2023-06-09: qty 1

## 2023-06-09 MED ORDER — SODIUM CHLORIDE 0.9 % IV BOLUS
1000.0000 mL | Freq: Once | INTRAVENOUS | Status: AC
  Administered 2023-06-09: 1000 mL via INTRAVENOUS

## 2023-06-09 MED ORDER — ACETAMINOPHEN 325 MG PO TABS
650.0000 mg | ORAL_TABLET | ORAL | Status: DC | PRN
  Administered 2023-06-10: 650 mg via ORAL
  Filled 2023-06-09 (×2): qty 2

## 2023-06-09 NOTE — Progress Notes (Signed)
 BRIEF PROGRESS NOTE:  Requested by primary team to comment on liver lesion seen on abdominal imaging 06/01/23. CT scan of abdomen and pelvis with IV contrast showed a 1.5 hyper-enhancing lesion in the right hepatic lobe, given stability favored to be a hemangioma. For more definitive diagnosis, a triple phase CT or MRI can be completed in the outpatient setting. This can be coordinated through [facility] or patient's PCP. If patient wanting to have imaging through [facility], recommend outpatient follow up with Dr. Dulce Sellar within the next 4 weeks. [facility] will be available as needed.   Liliane Shi, DO [facility]

## 2023-06-09 NOTE — Plan of Care (Signed)
  Problem: Education: Goal: Knowledge of General Education information will improve Description: Including pain rating scale, medication(s)/side effects and non-pharmacologic comfort measures Outcome: Progressing   Problem: Clinical Measurements: Goal: Ability to maintain clinical measurements within normal limits will improve Outcome: Progressing   Problem: Clinical Measurements: Goal: Will remain free from infection Outcome: Progressing   Problem: Clinical Measurements: Goal: Respiratory complications will improve Outcome: Progressing   

## 2023-06-09 NOTE — Plan of Care (Signed)
 Patient's device continues to perform well post op and pain managed appropriately. Patient encouraged to ambulate today

## 2023-06-09 NOTE — Progress Notes (Signed)
 5 Days Post-Op   Subjective/Chief Complaint: No complaints but feels a little bloated   Objective: Vital signs in last 24 hours: Temp:  [98.1 F (36.7 C)-98.7 F (37.1 C)] 98.3 F (36.8 C) (03/09 0536) Pulse Rate:  [71-80] 72 (03/09 0536) Resp:  [15-18] 15 (03/09 0536) BP: (141-163)/(66-96) 141/66 (03/09 0536) SpO2:  [94 %-96 %] 94 % (03/09 0536) Last BM Date : 06/08/23  Intake/Output from previous day: 03/08 0701 - 03/09 0700 In: 540 [P.O.:540] Out: 2065 [Urine:1400; Stool:665] Intake/Output this shift: No intake/output data recorded.  General appearance: alert and cooperative Resp: clear to auscultation bilaterally Cardio: regular rate and rhythm GI: soft, minimal tenderness. Incision looks good. Ostomy pink and productive  Lab Results:  Recent Labs    06/07/23 0844  WBC 5.9  HGB 13.4  HCT 41.3  PLT 347   BMET Recent Labs    06/07/23 0844 06/08/23 0652  NA 136 139  K 3.2* 3.7  CL 100 105  CO2 24 24  GLUCOSE 107* 111*  BUN 47* 41*  CREATININE 0.76 0.85  CALCIUM 8.8* 8.8*   PT/INR No results for input(s): "LABPROT", "INR" in the last 72 hours. ABG No results for input(s): "PHART", "HCO3" in the last 72 hours.  Invalid input(s): "PCO2", "PO2"  Studies/Results: No results found.  Anti-infectives: Anti-infectives (From admission, onward)    Start     Dose/Rate Route Frequency Ordered Stop   06/04/23 0930  cefoTEtan (CEFOTAN) 2 g in sodium chloride 0.9 % 100 mL IVPB       Note to Pharmacy: Pharmacy may adjust dose strength for optimal dosing.   Send with patient on call to the OR.  Anesthesia to complete antibiotic administration <24min prior to incision per [facility].   2 g 200 mL/hr over 30 Minutes Intravenous On call to O.R. 06/04/23 0923 06/04/23 1006   06/04/23 0927  sodium chloride 0.9 % with cefoTEtan (CEFOTAN) ADS Med       Note to Pharmacy: Raelyn Number J: cabinet override      06/04/23 0927 06/04/23 1001        Assessment/Plan: s/p Procedure(s): Exploratory laparotomy with sigmoid colectomy and end colostomy (Hartmann's procedure) (N/A) Advance diet LBO due to sigmoid stricture  S/p  Exploratory laparotomy with sigmoid colectomy and end colostomy (Hartmann's procedure) 3/4 Dr. Cliffton Asters   POD#5 - doing well  - having bowel function via colostomy - soft diet - WOC RN following for ostomy education - continue toradol for pain, robaxin  - PT/OT may need some help at home - ok for D/C from surgical standpoint  LOS: 8 days    Chevis Pretty III 06/09/2023

## 2023-06-09 NOTE — Progress Notes (Addendum)
 PROGRESS NOTE    Wayne Lowe  ZOX:[id number]  DOB: [birth year]  DOA: 06/01/2023 PCP: [facility] [place] Outpatient Specialists:   Hospital course:  73 year old man was admitted with large bowel/sigmoid obstruction.  Sigmoidoscopy showed no mass.  Patient underwent exploratory lap with end-to-end anastomosis and Hartman's pouch on 06/04/2023.  Patient tolerated well and diet is being advanced.  Subjective:   Patient states he is doing okay, able to tolerate milkshakes.  No vomiting today but was nauseated yesterday.  Does not feel like he can go home today per surgery discussion, was hoping to delay discharge tomorrow.   Objective: Vitals:   06/08/23 1218 06/08/23 1940 06/09/23 0536 06/09/23 0952  BP: (!) 163/83 (!) 148/78 (!) 141/66 (!) 142/71  Pulse: 71 80 72   Resp: 18 18 15    Temp: 98.7 F (37.1 C) 98.1 F (36.7 C) 98.3 F (36.8 C)   TempSrc: Oral     SpO2: 96% 96% 94%   Weight:      Height:        Intake/Output Summary (Last 24 hours) at 06/09/2023 1021 Last data filed at 06/09/2023 0636 Gross per 24 hour  Intake 540 ml  Output 1565 ml  Net -1025 ml   Filed Weights   06/01/23 1333 06/03/23 1206 06/04/23 0822  Weight: 88.5 kg 88.5 kg 88.5 kg     Exam:  General: Patient lying in bed in NAD, somewhat sleepy Eyes: sclera anicteric, conjuctiva mild injection bilaterally CVS: S1-S2, regular  Respiratory:  decreased air entry bilaterally secondary to decreased inspiratory effort, rales at bases  GI: Patient does have bowel sounds, his abdomen is distended and firm but not hard.  No tenderness to light palpation throughout.  No rebound tenderness, no guarding. LE: Warm and well-perfused Neuro: A/O x 3,  grossly nonfocal.  Psych: patient is logical and coherent, judgement and insight appear normal, mood and affect appropriate to situation.  Data Reviewed:  Basic Metabolic Panel: Recent Labs  Lab 06/03/23 0613 06/05/23 0500 06/06/23 0756  06/07/23 0844 06/08/23 0652  NA 139 136 138 136 139  K 4.1 3.2* 3.2* 3.2* 3.7  CL 108 104 102 100 105  CO2 22 23 25 24 24   GLUCOSE 111* 99 121* 107* 111*  BUN 15 21 23  47* 41*  CREATININE 0.70 0.82 0.74 0.76 0.85  CALCIUM 9.1 8.6* 8.8* 8.8* 8.8*    CBC: Recent Labs  Lab 06/03/23 0613 06/05/23 0500 06/07/23 0844  WBC 10.2 5.5 5.9  NEUTROABS 8.4* 4.1 4.3  HGB 14.0 13.7 13.4  HCT 43.5 43.2 41.3  MCV 92.8 92.9 90.6  PLT 342 306 347     Scheduled Meds:  amLODipine  10 mg Oral Daily   Chlorhexidine Gluconate Cloth  6 each Topical Daily   feeding supplement  1 Container Oral TID BM   finasteride  5 mg Oral Daily   FLUoxetine  80 mg Oral BH-q7a   ketorolac  15 mg Intravenous Q6H   lidocaine  1 patch Transdermal Q24H   methocarbamol (ROBAXIN) injection  1,000 mg Intravenous Q8H   tamsulosin  0.4 mg Oral Daily   traZODone  100 mg Oral QHS   Continuous Infusions:   Assessment & Plan:   Large bowel/sigmoid obstruction Flexible sigmoidoscopy 06/03/2023 without mass, did have intrinsic stricture S/p ex lap with sigmoid colectomy and end-to-end colostomy with Hartman's pouch on 06/04/2023 Diet is advancing, notes he is able to tolerate milkshakes Patient okay for discharge per general surgery  however patient states he does not feel like he can go home today and was hoping to delay discharge till tomorrow. Urine is quite dark, will provide 1 L NS bolus  HTN Continue amlodipine  QTc 521 on 06/03/2023 Repeat EKG to ensure it has normalized with normalization of electrolytes---Qtc still elevated at 513, although improved from admission.  On review of meds, I do not see any contributory medications.  Can consider cardiology follow-up as an outpatient per PCP.  Possible early colovesicular fistula seen on CT UA is negative He is now s/p sigmoid colectomy  Abnormal liver lesion, likely hemangioma 1.5 cm hyperenhancing lesion in the right hepatic lobe thought to be  hemangioma However radiology recommending possible multiphasic CT or MRI GI was consulted,      DVT prophylaxis: SCD Code Status: Full Family Communication: None today     Studies: No results found.  Principal Problem:   Bowel obstruction (HCC) Active Problems:   Colovesical fistula   Rib fractures   Hypokalemia   QT prolongation   Essential hypertension     Wayne Lowe Orma Flaming, [facility]  If 7PM-7AM, please contact night-coverage [email or web address]   LOS: 8 days

## 2023-06-10 DIAGNOSIS — K56609 Unspecified intestinal obstruction, unspecified as to partial versus complete obstruction: Secondary | ICD-10-CM | POA: Diagnosis not present

## 2023-06-10 LAB — BASIC METABOLIC PANEL
Anion gap: 8 (ref 5–15)
BUN: 21 mg/dL (ref 8–23)
CO2: 23 mmol/L (ref 22–32)
Calcium: 8 mg/dL — ABNORMAL LOW (ref 8.9–10.3)
Chloride: 108 mmol/L (ref 98–111)
Creatinine, Ser: 0.77 mg/dL (ref 0.61–1.24)
GFR, Estimated: >60 mL/min (ref >60)
Glucose, Bld: 121 mg/dL — ABNORMAL HIGH (ref 70–99)
Potassium: 2.9 mmol/L — ABNORMAL LOW (ref 3.5–5.1)
Sodium: 139 mmol/L (ref 135–145)

## 2023-06-10 MED ORDER — POTASSIUM CHLORIDE CRYS ER 20 MEQ PO TBCR
30.0000 meq | EXTENDED_RELEASE_TABLET | ORAL | Status: DC
  Administered 2023-06-10 (×2): 30 meq via ORAL
  Filled 2023-06-10 (×2): qty 1

## 2023-06-10 MED ORDER — METHOCARBAMOL 500 MG PO TABS: 500.0000 mg | ORAL_TABLET | Freq: Four times a day (QID) | ORAL | 0 refills | Status: DC | PRN

## 2023-06-10 MED ORDER — TAMSULOSIN HCL 0.4 MG PO CAPS: 0.4000 mg | ORAL_CAPSULE | Freq: Every day | ORAL | 0 refills | Status: AC

## 2023-06-10 MED ORDER — OXYCODONE HCL 5 MG PO TABS: 5.0000 mg | ORAL_TABLET | Freq: Three times a day (TID) | ORAL | 0 refills | Status: DC | PRN

## 2023-06-10 MED ORDER — AMLODIPINE BESYLATE 10 MG PO TABS: 10.0000 mg | ORAL_TABLET | Freq: Every day | ORAL | 0 refills | Status: DC

## 2023-06-10 NOTE — Plan of Care (Signed)
  Problem: Clinical Measurements: Goal: Will remain free from infection Outcome: Progressing   Problem: Nutrition: Goal: Adequate nutrition will be maintained Outcome: Progressing   Problem: Pain Managment: Goal: General experience of comfort will improve and/or be controlled Outcome: Progressing

## 2023-06-10 NOTE — Progress Notes (Signed)
 Occupational Therapy Treatment Patient Details Name: Wayne Lowe MRN: [id number] DOB: [birth year] Today's Date: 06/10/2023   History of present illness 73 yo male admitted with bowel obstruction. 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 Pt also found to have some rib fxs on imaging. Hx of COVID, anxiety, dizziness, tremor, LBP, RLS, memory changes.   OT comments  This 73 yo male seen today to address OOB with HOB flat and no rails and ADLs. He was able with CGA to get OOB by rolling and pushing up to sit from sidelying. He needs A for LB ADLs today due to low back pain and reports wife can A with this. He is CGA from RW level in room with VCs for safe hand placement with sit<>stand from RW. He will continue to benefit from acute OT if D/C is delayed without need for follow up once D/C'd home.      If plan is discharge home, recommend the following:  A little help with walking and/or transfers;A lot of help with bathing/dressing/bathroom;Assistance with cooking/housework;Assist for transportation;Help with stairs or ramp for entrance   Equipment Recommendations  None recommended by OT       Precautions / Restrictions Precautions Precautions: Fall Precaution/Restrictions Comments: L colostomy Restrictions Weight Bearing Restrictions Per Provider Order: No       Mobility Bed Mobility Overal bed mobility: Needs Assistance Bed Mobility: Rolling, Sit to Supine Rolling: Supervision (no rail and HOB flat) Sidelying to sit: Supervision (no rail and HOB flat)            Transfers Overall transfer level: Needs assistance Equipment used: Rolling walker (2 wheels) Transfers: Sit to/from Stand Sit to Stand: Contact guard assist           General transfer comment: Cues for safe hand placement     Balance Overall balance assessment: Needs assistance Sitting-balance support: No upper extremity supported, Feet supported Sitting balance-Leahy Scale: Good     Standing  balance support: No upper extremity supported, During functional activity Standing balance-Leahy Scale: Fair Standing balance comment: standing at sink to brush teeth                           ADL either performed or assessed with clinical judgement   ADL Overall ADL's : Needs assistance/impaired     Grooming: Oral care;Standing;Contact guard assist         Lower Body Bathing Details (indicate cue type and reason): total A to donn socks--reports back is hurting and that he cannot get his legs into a "figure 4 position" to get to his feet (reports his wife will A him)         Toilet Transfer: Contact guard assist;Rolling walker (2 wheels) Toilet Transfer Details (indicate cue type and reason): simulated bed>around to other side of bed and back>sit on bed> stand and turn to recliner at [employer] level   Toileting - Clothing Manipulation Details (indicate cue type and reason): total A for colostomy care            Extremity/Trunk Assessment Upper Extremity Assessment Upper Extremity Assessment: Overall WFL for tasks assessed            Vision Patient Visual Report: No change from baseline           Communication Communication Communication: No apparent difficulties   Cognition Arousal: Alert Behavior During Therapy: WFL for tasks assessed/performed Cognition: No apparent impairments  Following commands: Intact                      Pertinent Vitals/ Pain       Pain Assessment Pain Assessment: Faces Faces Pain Scale: Hurts little more Pain Location: right middle lower back Pain Descriptors / Indicators: Discomfort, Sore, Aching Pain Intervention(s): Limited activity within patient's tolerance, Monitored during session, Heat applied         Frequency  Min 2X/week        Progress Toward Goals  OT Goals(current goals can now be found in the care plan section)  Progress towards OT goals: Progressing  toward goals  Acute Rehab OT Goals Patient Stated Goal: to go home today OT Goal Formulation: With patient Time For Goal Achievement: 06/19/23 Potential to Achieve Goals: Good  Plan         AM-PAC OT "6 Clicks" Daily Activity     Outcome Measure   Help from another person eating meals?: None Help from another person taking care of personal grooming?: A Little Help from another person toileting, which includes using toliet, bedpan, or urinal?: A Lot Help from another person bathing (including washing, rinsing, drying)?: A Little Help from another person to put on and taking off regular upper body clothing?: A Little Help from another person to put on and taking off regular lower body clothing?: A Lot 6 Click Score: 17    End of Session Equipment Utilized During Treatment: Gait belt;Rolling walker (2 wheels)  OT Visit Diagnosis: Unsteadiness on feet (R26.81);Muscle weakness (generalized) (M62.81);Pain Pain - Right/Left: Right Pain - part of body:  (lower middle back)   Activity Tolerance Patient tolerated treatment well   Patient Left in chair;with call bell/phone within reach;with chair alarm set   Nurse Communication  (pt needs colostomy emptied)        Time: 1478-2956 OT Time Calculation (min): 24 min  Charges: OT General Charges $OT Visit: 1 Visit OT Treatments $Self Care/Home Management : 23-37 mins  Lindon Romp OT Acute Rehabilitation Services Office [phone]   Evette Georges 06/10/2023, 11:35 AM

## 2023-06-10 NOTE — Discharge Summary (Signed)
 Physician Discharge Summary  KAZIM CORRALES ZOX:[id number] DOB: 22-Aug-1950 DOA: 06/01/2023  PCP: [facility] [place]  Admit date: 06/01/2023 Discharge date: 06/10/2023  Admitted From: Home Disposition: Home  Recommendations for Outpatient Follow-up:  Follow up with PCP in 1-2 weeks Follow-up with general surgery as scheduled on 06/19/2023 Outpatient follow-up with [facility] gastroenterology, Dr. Dulce Sellar 4 weeks in regards to hyperenhancing lesion right hepatic lobe with consideration of a triple phase CT or MRI outpatients Amlodipine increased to 10 mg p.o. daily  Home Health: PT Equipment/Devices: Ostomy  Discharge Condition: Stable CODE STATUS: Full code Diet recommendation: Soft diet  History of present illness:  Wayne Lowe is a 73 year old male with past medical history significant for HTN, BPH, depression, insomnia who presented with abdominal pain and was found to have a large bowel/sigmoid obstruction.  Gastroenterology was consulted and patient underwent sigmoidoscopy which showed no mass.  Patient underwent exploratory laparotomy with end to end anastomosis with Hartman's pouch on 06/04/2023 by Dr. Cliffton Asters.  Hospital course:  Sigmoid colon obstruction / Diverticulitis versus possible malignant obstruction CT showing diffuse colonic dilation with obstruction at the level of the sigmoid colon and there is mild adjacent fat stranding which could be secondary to ongoing diverticulitis, however, a malignant obstruction needs to be excluded by endoscopy.  Borderline leukocytosis on labs, no fever or signs of sepsis.  [facility]en by [facility], underwent flexible sigmoidoscopy 06/03/23 which demonstrated an intrinsic stricture of the sigmoid colon that was unable to be traversed. There is no evident mass within his colon.  Per GI recommendations, general surgery consulted and patient underwent Exploratory laparotomy with sigmoid colectomy and end colostomy (Hartmann's procedure) by Dr.  Cliffton Asters 06/04/2023.patient's diet was slowly advanced with toleration, having liquid stools in ostomy.  General surgery with no further recommendations and okay for discharge with outpatient follow-up scheduled.    Delirium: Resolved Reports of patient being confused and agitated overnight on 06/05/2023 and required hand mittens.  Clean confused because of opioid pain medications.  Mentation now back at baseline.   Colovesical fistula CT showing a soft tissue tract extending from the sigmoid colon to the bladder suspicious for early fistula development and cystitis.  However, UA is not suggestive of infection.  Per general surgery.   Right 9th and 10th rib fractures Discussed with the patient and fractures appear to be subacute as he reports slipping on ice and falling 6 weeks ago.  Initially had pain at that time but no longer having any rib pain or discomfort now.   Right hepatic lobe lesion CT showing a stable 1.5 cm hyperenhancing lesion in the right hepatic lobe favored to be a hemangioma per radiologist.  However, definitive diagnosis would require multiphasic CT or MRI.  GI consulted and recommend outpatient follow-up with Dr. Dulce Sellar in 4 weeks.   Hypokalemia:  Repleted during hospitalization.   QT prolongation QTc 521 on EKG.  Monitor potassium and magnesium levels, continue to replace as needed.  Avoid QT prolonging drugs.    Mild metabolic acidosis Resolved with IV fluids.   Hypertension Blood pressure still slightly elevated during hospitalization and amlodipine increased to 10 mg p.o. daily.  Will increase amlodipine to 10 mg.     Acute urinary retention: Resolved  Likely due to obstruction, Foley placed on 06/04/2023.  Continue Proscar.  Foley removed 06/06/2023, has voided multiple times.  Continue Proscar 5 mg p.o. daily, started tamsulosin 0.4 mg p.o. daily.  Discharge Diagnoses:  Principal Problem:   Bowel obstruction (  HCC) Active Problems:   Colovesical fistula   Rib  fractures   Hypokalemia   QT prolongation   Essential hypertension    Discharge Instructions  Discharge Instructions     Amb Referral to Ostomy Clinic   Complete by: As directed    Reason for referral modifiers: Pre and post-operative counseling for ostomy management   Call MD for:  difficulty breathing, headache or visual disturbances   Complete by: As directed    Call MD for:  extreme fatigue   Complete by: As directed    Call MD for:  persistant dizziness or light-headedness   Complete by: As directed    Call MD for:  persistant nausea and vomiting   Complete by: As directed    Call MD for:  severe uncontrolled pain   Complete by: As directed    Call MD for:  temperature >100.4   Complete by: As directed    Diet - low sodium heart healthy   Complete by: As directed    Increase activity slowly   Complete by: As directed    No wound care   Complete by: As directed       Allergies as of 06/10/2023       Reactions   Buspar [buspirone]    EXACERBATED IBS   Crestor [rosuvastatin]    BODY ACHES   Erythromycin    GI DISTRESS   Lipitor [atorvastatin]    JOINT PAIN   Lovastatin    MUSCLE ACHES   Pravachol [pravastatin]    MM WEAKNESS   Wellbutrin [bupropion]         Medication List     STOP taking these medications    amoxicillin-clavulanate 875-125 MG tablet Commonly known as: AUGMENTIN       TAKE these medications    amLODipine 10 MG tablet Commonly known as: NORVASC Take 1 tablet (10 mg total) by mouth daily. Start taking on: June 11, 2023 What changed:  medication strength how much to take   finasteride 5 MG tablet Commonly known as: PROSCAR Take 5 mg by mouth daily.   FLUoxetine 40 MG capsule Commonly known as: PROZAC Take 80 mg by mouth every morning.   methocarbamol 500 MG tablet Commonly known as: ROBAXIN Take 1 tablet (500 mg total) by mouth every 6 (six) hours as needed for muscle spasms.   oxyCODONE 5 MG immediate release  tablet Commonly known as: Roxicodone Take 1 tablet (5 mg total) by mouth every 8 (eight) hours as needed for moderate pain (pain score 4-6).   tamsulosin 0.4 MG Caps capsule Commonly known as: FLOMAX Take 1 capsule (0.4 mg total) by mouth daily. Start taking on: June 11, 2023   traZODone 100 MG tablet Commonly known as: DESYREL Take 100 mg by mouth at bedtime.        Follow-up Information     [facility], [place]. Go on 06/19/2023.   Specialty: General Surgery Why: For staple removal by a nurse. please arrive 20 minutes early to your appointment. call to confirm appointment date/time. Contact information: [address] [place] [address] [phone]        Andria Meuse, MD Follow up.   Specialties: General Surgery, Colon and Rectal Surgery Why: our office is scheduling you for post-operative follow up. call to confirm appointment date/time. Contact information: [address] [place] [address] [phone]         Retail, [facility] Follow up.  Why: Can contact this supply company regarding a shower seat Contact information: [address] [place] [address] [phone]         Care, [facility] Follow up.   Specialty: Home Health Services Why: Frances Furbish will follow up with you at discharge to provide home health physical therapy Contact information: [address] [place] [address] [phone]                Allergies  Allergen Reactions   Buspar [Buspirone]     EXACERBATED IBS    Crestor [Rosuvastatin]     BODY ACHES   Erythromycin     GI DISTRESS   Lipitor [Atorvastatin]     JOINT PAIN    Lovastatin     MUSCLE ACHES    Pravachol [Pravastatin]     MM WEAKNESS   Wellbutrin [Bupropion]     Consultations: Gastroenterology General Surgery   Procedures/Studies: X-ray abdomen AP Result Date: 06/04/2023 CLINICAL DATA:   NG tube placement. EXAM: ABDOMEN - 1 VIEW COMPARISON:  CT 06/01/2023 FINDINGS: Tip and side port of the enteric tube below the diaphragm in the stomach. Dilated loop of bowel in the mid upper abdomen likely represents transverse colon when compared with prior CT. IMPRESSION: Tip and side port of the enteric tube below the diaphragm in the stomach. Electronically Signed   By: Narda Rutherford M.D.   On: 06/04/2023 16:09   CT ABDOMEN PELVIS W CONTRAST Result Date: 06/01/2023 CLINICAL DATA:  Diverticulitis.  Bowel obstruction suspected. EXAM: CT ABDOMEN AND PELVIS WITH CONTRAST TECHNIQUE: Multidetector CT imaging of the abdomen and pelvis was performed using the standard protocol following bolus administration of intravenous contrast. RADIATION DOSE REDUCTION: This exam was performed according to the departmental dose-optimization program which includes automated exposure control, adjustment of the mA and/or kV according to patient size and/or use of iterative reconstruction technique. CONTRAST:  OMNIPAQUE IOHEXOL 300 MG/ML  SOLN COMPARISON:  10/24/2022 FINDINGS: Lower chest: No acute abnormality. Hepatobiliary: 1.5 cm hyperenhancing lesion seen in the right hepatic lobe (image 19, series 4). It is not significantly changed since prior examination. The stability favors a benign etiology such as a hemangioma, however definitive diagnosis would require multiphasic CT or MRI. The liver, gallbladder, and bile ducts are otherwise unremarkable. Pancreas: Unremarkable. No pancreatic ductal dilatation or surrounding inflammatory changes. Spleen: Normal in size without focal abnormality. Adrenals/Urinary Tract: Adrenal glands are normal. Numerous bilateral simple cysts do not require dedicated imaging surveillance. The largest is located in the lower pole the right kidney measuring 7.1 cm. There are also numerous subcentimeter renal hypodensities which are too small to fully characterize. These also likely represent  simple cysts and do not require dedicated imaging follow-up. No hydronephrosis or hydroureter. There is thickening of the bladder dome, greater to the left with adjacent fat stranding. There is a soft tissue tract extending from the sigmoid colon to the bladder, best seen on the coronal series images 81-95. This is suspicious for early fistula development. Stomach/Bowel: Small hiatal hernia. Stomach and doudenum otherwise unremarkable. Appendix is normal. Diffuse colonic dilatation with obstruction at the level of the sigmoid colon, best seen on image 76 of series 4. There is mild adjacent fat stranding which could be secondary to ongoing diverticulitis, however a malignant obstruction at this site needs to be excluded by endoscopy. Vascular/Lymphatic: Calcified atheromatous plaque seen throughout the abdominal aorta and visualized branches. No enlarged abdominal or pelvic lymph nodes. Reproductive: Prostate is mildly enlarged nodular impressing  upon the bladder neck. Testes appear incompletely descended, located within the inguinal canals bilaterally. Other: No abdominal wall hernia or abnormality. No abdominopelvic ascites. Musculoskeletal: Interval development of acute to subacute fractures at the posterolateral segments of the right ninth and tenth ribs. IMPRESSION: 1. Diffuse colonic dilatation with obstruction at the level of the sigmoid colon. There is mild adjacent fat stranding which could be secondary to ongoing diverticulitis, however a malignant obstruction at this site needs to be excluded by endoscopy. 2. Thickening of the bladder dome, greater to the left with adjacent fat stranding. There is a soft tissue tract extending from the sigmoid colon to the bladder, best seen on the coronal series. This is suspicious for early fistula development and cystitis. 3. Interval development of acute to subacute fractures at the posterolateral segments of the right ninth and tenth ribs. 4. 1.5 cm hyperenhancing  lesion in the right hepatic lobe is not significantly changed since prior examination. The stability favors a benign etiology such as a hemangioma, however definitive diagnosis would require multiphasic CT or MRI. 5. Aortic atherosclerosis. Aortic Atherosclerosis (ICD10-I70.0). Electronically Signed   By: Acquanetta Belling M.D.   On: 06/01/2023 20:25     Subjective: The patient seen examined at bedside, lying in bed.  RN present at bedside changing his ostomy bag.  Continues with liquid stool, tolerating diet.  Seen by general surgeon's morning okay for discharge home.  No other specific complaints, concerns or questions at this time.  Denies headache, no dizziness, no chest pain, no palpitations, no shortness of breath, no abdominal pain, no fever/chills/night sweats, no nausea/vomiting.  No acute events overnight per nursing.  Discharge Exam: Vitals:   06/10/23 0415 06/10/23 0917  BP: 134/79 134/79  Pulse: 75   Resp: 16   Temp: 98 F (36.7 C)   SpO2: 97%    Vitals:   06/09/23 1442 06/09/23 1907 06/10/23 0415 06/10/23 0917  BP: 137/73 138/68 134/79 134/79  Pulse: 77 73 75   Resp: 20 16 16    Temp: 97.9 F (36.6 C) (!) 97 F (36.1 C) 98 F (36.7 C)   TempSrc:   Oral   SpO2: 100% 97% 97%   Weight:      Height:        Physical Exam: GEN: NAD, alert and oriented x 3 HEENT: NCAT, PERRL, EOMI, sclera clear, MMM PULM: CTAB w/o wheezes/crackles, normal respiratory effort, on room air CV: RRR w/o M/G/R GI: abd soft, NTND, + BS; liquid brown stool noted in ostomy MSK: no peripheral edema, muscle strength globally intact 5/5 bilateral upper/lower extremities NEURO: No focal neurological deficits PSYCH: normal mood/affect Integumentary: No concerning rashes/lesions/wounds noted on exposed skin surfaces    The results of significant diagnostics from this hospitalization (including imaging, microbiology, ancillary and laboratory) are listed below for reference.     Microbiology: No  results found for this or any previous visit (from the past 240 hours).   Labs: BNP (last 3 results) No results for input(s): "BNP" in the last 8760 hours. Basic Metabolic Panel: Recent Labs  Lab 06/05/23 0500 06/06/23 0756 06/07/23 0844 06/08/23 0652 06/10/23 0511  NA 136 138 136 139 139  K 3.2* 3.2* 3.2* 3.7 2.9*  CL 104 102 100 105 108  CO2 23 25 24 24 23   GLUCOSE 99 121* 107* 111* 121*  BUN 21 23 47* 41* 21  CREATININE 0.82 0.74 0.76 0.85 0.77  CALCIUM 8.6* 8.8* 8.8* 8.8* 8.0*   Liver Function Tests: No results for  input(s): "AST", "ALT", "ALKPHOS", "BILITOT", "PROT", "ALBUMIN" in the last 168 hours. No results for input(s): "LIPASE", "AMYLASE" in the last 168 hours. No results for input(s): "AMMONIA" in the last 168 hours. CBC: Recent Labs  Lab 06/05/23 0500 06/07/23 0844  WBC 5.5 5.9  NEUTROABS 4.1 4.3  HGB 13.7 13.4  HCT 43.2 41.3  MCV 92.9 90.6  PLT 306 347   Cardiac Enzymes: No results for input(s): "CKTOTAL", "CKMB", "CKMBINDEX", "TROPONINI" in the last 168 hours. BNP: Invalid input(s): "POCBNP" CBG: No results for input(s): "GLUCAP" in the last 168 hours. D-Dimer No results for input(s): "DDIMER" in the last 72 hours. Hgb A1c No results for input(s): "HGBA1C" in the last 72 hours. Lipid Profile No results for input(s): "CHOL", "HDL", "LDLCALC", "TRIG", "CHOLHDL", "LDLDIRECT" in the last 72 hours. Thyroid function studies No results for input(s): "TSH", "T4TOTAL", "T3FREE", "THYROIDAB" in the last 72 hours.  Invalid input(s): "FREET3" Anemia work up No results for input(s): "VITAMINB12", "FOLATE", "FERRITIN", "TIBC", "IRON", "RETICCTPCT" in the last 72 hours. Urinalysis    Component Value Date/Time   COLORURINE AMBER (A) 06/01/2023 2026   APPEARANCEUR HAZY (A) 06/01/2023 2026   LABSPEC 1.035 (H) 06/01/2023 2026   PHURINE 5.0 06/01/2023 2026   GLUCOSEU NEGATIVE 06/01/2023 2026   HGBUR NEGATIVE 06/01/2023 2026   BILIRUBINUR NEGATIVE 06/01/2023  2026   KETONESUR 20 (A) 06/01/2023 2026   PROTEINUR 100 (A) 06/01/2023 2026   NITRITE NEGATIVE 06/01/2023 2026   LEUKOCYTESUR NEGATIVE 06/01/2023 2026   Sepsis Labs Recent Labs  Lab 06/05/23 0500 06/07/23 0844  WBC 5.5 5.9   Microbiology No results found for this or any previous visit (from the past 240 hours).   Time coordinating discharge: Over 30 minutes  SIGNED:   Alvira Philips [place], DO  [facility] 06/10/2023, 10:47 AM

## 2023-06-10 NOTE — Progress Notes (Signed)
 6 Days Post-Op   Subjective/Chief Complaint: No complaints, wants to go home   Objective: Vital signs in last 24 hours: Temp:  [97 F (36.1 C)-98 F (36.7 C)] 98 F (36.7 C) (03/10 0415) Pulse Rate:  [73-77] 75 (03/10 0415) Resp:  [16-20] 16 (03/10 0415) BP: (134-138)/(68-79) 134/79 (03/10 0917) SpO2:  [97 %-100 %] 97 % (03/10 0415) Last BM Date : 06/08/23  Intake/Output from previous day: 03/09 0701 - 03/10 0700 In: 480 [P.O.:480] Out: 1100 [Urine:500; Stool:600] Intake/Output this shift: No intake/output data recorded.  General appearance: alert and cooperative Resp: clear to auscultation bilaterally Cardio: regular rate and rhythm GI: soft, no tenderness. Incision clean, dry and intact without any cellulitis however there is serous drainage from the lower aspect of the incision. Ostomy pink and productive  Lab Results:  No results for input(s): "WBC", "HGB", "HCT", "PLT" in the last 72 hours.  BMET Recent Labs    06/08/23 0652 06/10/23 0511  NA 139 139  K 3.7 2.9*  CL 105 108  CO2 24 23  GLUCOSE 111* 121*  BUN 41* 21  CREATININE 0.85 0.77  CALCIUM 8.8* 8.0*   PT/INR No results for input(s): "LABPROT", "INR" in the last 72 hours. ABG No results for input(s): "PHART", "HCO3" in the last 72 hours.  Invalid input(s): "PCO2", "PO2"  Studies/Results: No results found.  Anti-infectives: Anti-infectives (From admission, onward)    Start     Dose/Rate Route Frequency Ordered Stop   06/04/23 0930  cefoTEtan (CEFOTAN) 2 g in sodium chloride 0.9 % 100 mL IVPB       Note to Pharmacy: Pharmacy may adjust dose strength for optimal dosing.   Send with patient on call to the OR.  Anesthesia to complete antibiotic administration <78min prior to incision per [facility] [place].   2 g 200 mL/hr over 30 Minutes Intravenous On call to O.R. 06/04/23 0923 06/04/23 1006   06/04/23 0927  sodium chloride 0.9 % with cefoTEtan (CEFOTAN) ADS Med       Note to Pharmacy: Raelyn Number J: cabinet override      06/04/23 0927 06/04/23 1001       Assessment/Plan: s/p Procedure(s): Exploratory laparotomy with sigmoid colectomy and end colostomy (Hartmann's procedure) (N/A) Advance diet LBO due to sigmoid stricture  S/p  Exploratory laparotomy with sigmoid colectomy and end colostomy (Hartmann's procedure) 3/4 Dr. Cliffton Asters   POD#6 - doing well  - having bowel function via colostomy - soft diet - WOC RN following for ostomy education - continue toradol for pain, robaxin  - PT/OT may need some help at home - ok for D/C from surgical standpoint  LOS: 9 days    Berna Bue 06/10/2023

## 2023-06-10 NOTE — TOC Transition Note (Signed)
 Transition of Care [facility]) - Discharge Note   Patient Details  Name: Wayne Lowe MRN: [id number] Date of Birth: [birth year]  Transition of Care [facility]) CM/SW Contact:  Otelia Santee, LCSW Phone Number: 06/10/2023, 11:01 AM   Clinical Narrative:    Pt to return home with spouse. Pt will be receiving HHPT w/ [facility]. HH orders in place. No DME needs. TOC signing off.    Final next level of care: Home w Home Health Services     Patient Goals and CMS Choice Patient states their goals for this hospitalization and ongoing recovery are:: To return home CMS Medicare.gov Compare Post Acute Care list provided to:: Patient   [place] ownership interest in [facility].provided to::  (NA)    Discharge Placement                       Discharge Plan and Services Additional resources added to the After Visit Summary for                  DME Arranged: N/A DME Agency: NA       HH Arranged: PT HH Agency: [facility] Date [facility] Agency Contacted: 06/07/23 Time HH Agency Contacted: 1525 Representative spoke with at [facility] Agency: Cindie  Social Drivers of Health (SDOH) Interventions SDOH Screenings   Food Insecurity: No Food Insecurity (06/02/2023)  Housing: Low Risk  (06/02/2023)  Transportation Needs: No Transportation Needs (06/02/2023)  Utilities: Not At Risk (06/02/2023)  Social Connections: Moderately Integrated (06/02/2023)  Tobacco Use: Medium Risk (06/04/2023)     Readmission Risk Interventions    06/03/2023    3:11 PM  Readmission Risk Prevention Plan  Post Dischage Appt Complete  Medication Screening Complete  Transportation Screening Complete

## 2023-06-10 NOTE — Care Management Important Message (Signed)
 Important Message  Patient Details IM Letter given to the Patient. Name: JHOVANNY GUINTA MRN: [id number] Date of Birth: 1950-07-02   Important Message Given:  Yes - Medicare IM     Caren Macadam 06/10/2023, 11:14 AM

## 2023-06-10 NOTE — Consult Note (Signed)
 Planning for DC today Contacted wife by phone to verify needs for DC. She is most concerned about supplies; I have marked Engineer, site with items patient is using and I have provided both [facility] and [facility] contact numbers for wife with directions for ordering supplies.  Patient requesting Purewick for home. I have provided information for patient and explained out of pocket expense with additional expense for actually purewick disposables   I have suggested patient be more mobile and not rely on this device.  He is most worried about wife having to change bed at night if he can not make it. Suggested use of disposable brief at night instead of expensive device and that his mobility will be much more improved, he will heal faster and become more independent the more he moves out of the bed.   Can offer to take urinal home as well.   May need BSD, will reach out to MD/TOC on this.   I have left 8 soft convex 1pc in the room along with 8 barrier ring, pattern for cutting skin barrier. Educational materials with images of pouch change.   I have requested ostomy outpt clinic referral as well.   WOC Nurse will follow along with you for continued support with ostomy teaching and care Jazae Gandolfi [facility], RN, [place], CNS, [place] [phone]

## 2023-06-13 DIAGNOSIS — Z9181 History of falling: Secondary | ICD-10-CM | POA: Diagnosis not present

## 2023-06-13 DIAGNOSIS — K589 Irritable bowel syndrome without diarrhea: Secondary | ICD-10-CM | POA: Diagnosis not present

## 2023-06-13 DIAGNOSIS — I700 Atherosclerosis of aorta: Secondary | ICD-10-CM | POA: Diagnosis not present

## 2023-06-13 DIAGNOSIS — N400 Enlarged prostate without lower urinary tract symptoms: Secondary | ICD-10-CM | POA: Diagnosis not present

## 2023-06-13 DIAGNOSIS — Z933 Colostomy status: Secondary | ICD-10-CM | POA: Diagnosis not present

## 2023-06-13 DIAGNOSIS — K769 Liver disease, unspecified: Secondary | ICD-10-CM | POA: Diagnosis not present

## 2023-06-13 DIAGNOSIS — F32A Depression, unspecified: Secondary | ICD-10-CM | POA: Diagnosis not present

## 2023-06-13 DIAGNOSIS — D72820 Lymphocytosis (symptomatic): Secondary | ICD-10-CM | POA: Diagnosis not present

## 2023-06-13 DIAGNOSIS — N321 Vesicointestinal fistula: Secondary | ICD-10-CM | POA: Diagnosis not present

## 2023-06-13 DIAGNOSIS — Z48815 Encounter for surgical aftercare following surgery on the digestive system: Secondary | ICD-10-CM | POA: Diagnosis not present

## 2023-06-13 DIAGNOSIS — Z9049 Acquired absence of other specified parts of digestive tract: Secondary | ICD-10-CM | POA: Diagnosis not present

## 2023-06-13 DIAGNOSIS — D72829 Elevated white blood cell count, unspecified: Secondary | ICD-10-CM | POA: Diagnosis not present

## 2023-06-13 DIAGNOSIS — E876 Hypokalemia: Secondary | ICD-10-CM | POA: Diagnosis not present

## 2023-06-13 DIAGNOSIS — I10 Essential (primary) hypertension: Secondary | ICD-10-CM | POA: Diagnosis not present

## 2023-06-13 DIAGNOSIS — S2241XD Multiple fractures of ribs, right side, subsequent encounter for fracture with routine healing: Secondary | ICD-10-CM | POA: Diagnosis not present

## 2023-06-13 DIAGNOSIS — G4700 Insomnia, unspecified: Secondary | ICD-10-CM | POA: Diagnosis not present

## 2023-06-20 DIAGNOSIS — S2241XD Multiple fractures of ribs, right side, subsequent encounter for fracture with routine healing: Secondary | ICD-10-CM | POA: Diagnosis not present

## 2023-06-20 DIAGNOSIS — D72829 Elevated white blood cell count, unspecified: Secondary | ICD-10-CM | POA: Diagnosis not present

## 2023-06-20 DIAGNOSIS — K589 Irritable bowel syndrome without diarrhea: Secondary | ICD-10-CM | POA: Diagnosis not present

## 2023-06-20 DIAGNOSIS — Z48815 Encounter for surgical aftercare following surgery on the digestive system: Secondary | ICD-10-CM | POA: Diagnosis not present

## 2023-06-20 DIAGNOSIS — G4700 Insomnia, unspecified: Secondary | ICD-10-CM | POA: Diagnosis not present

## 2023-06-20 DIAGNOSIS — Z9181 History of falling: Secondary | ICD-10-CM | POA: Diagnosis not present

## 2023-06-20 DIAGNOSIS — I700 Atherosclerosis of aorta: Secondary | ICD-10-CM | POA: Diagnosis not present

## 2023-06-20 DIAGNOSIS — E876 Hypokalemia: Secondary | ICD-10-CM | POA: Diagnosis not present

## 2023-06-20 DIAGNOSIS — I10 Essential (primary) hypertension: Secondary | ICD-10-CM | POA: Diagnosis not present

## 2023-06-20 DIAGNOSIS — N400 Enlarged prostate without lower urinary tract symptoms: Secondary | ICD-10-CM | POA: Diagnosis not present

## 2023-06-20 DIAGNOSIS — Z933 Colostomy status: Secondary | ICD-10-CM | POA: Diagnosis not present

## 2023-06-20 DIAGNOSIS — N321 Vesicointestinal fistula: Secondary | ICD-10-CM | POA: Diagnosis not present

## 2023-06-20 DIAGNOSIS — Z9049 Acquired absence of other specified parts of digestive tract: Secondary | ICD-10-CM | POA: Diagnosis not present

## 2023-06-20 DIAGNOSIS — F32A Depression, unspecified: Secondary | ICD-10-CM | POA: Diagnosis not present

## 2023-06-20 DIAGNOSIS — K769 Liver disease, unspecified: Secondary | ICD-10-CM | POA: Diagnosis not present

## 2023-06-24 DIAGNOSIS — Z933 Colostomy status: Secondary | ICD-10-CM | POA: Diagnosis not present

## 2023-06-24 DIAGNOSIS — I10 Essential (primary) hypertension: Secondary | ICD-10-CM | POA: Diagnosis not present

## 2023-06-24 DIAGNOSIS — F321 Major depressive disorder, single episode, moderate: Secondary | ICD-10-CM | POA: Diagnosis not present

## 2023-06-24 DIAGNOSIS — T8149XA Infection following a procedure, other surgical site, initial encounter: Secondary | ICD-10-CM | POA: Diagnosis not present

## 2023-06-24 DIAGNOSIS — Z79899 Other long term (current) drug therapy: Secondary | ICD-10-CM | POA: Diagnosis not present

## 2023-06-27 DIAGNOSIS — G4700 Insomnia, unspecified: Secondary | ICD-10-CM | POA: Diagnosis not present

## 2023-06-27 DIAGNOSIS — Z933 Colostomy status: Secondary | ICD-10-CM | POA: Diagnosis not present

## 2023-06-27 DIAGNOSIS — K769 Liver disease, unspecified: Secondary | ICD-10-CM | POA: Diagnosis not present

## 2023-06-27 DIAGNOSIS — S2241XD Multiple fractures of ribs, right side, subsequent encounter for fracture with routine healing: Secondary | ICD-10-CM | POA: Diagnosis not present

## 2023-06-27 DIAGNOSIS — Z48815 Encounter for surgical aftercare following surgery on the digestive system: Secondary | ICD-10-CM | POA: Diagnosis not present

## 2023-06-27 DIAGNOSIS — I10 Essential (primary) hypertension: Secondary | ICD-10-CM | POA: Diagnosis not present

## 2023-06-27 DIAGNOSIS — I700 Atherosclerosis of aorta: Secondary | ICD-10-CM | POA: Diagnosis not present

## 2023-06-27 DIAGNOSIS — N400 Enlarged prostate without lower urinary tract symptoms: Secondary | ICD-10-CM | POA: Diagnosis not present

## 2023-06-27 DIAGNOSIS — Z9181 History of falling: Secondary | ICD-10-CM | POA: Diagnosis not present

## 2023-06-27 DIAGNOSIS — N321 Vesicointestinal fistula: Secondary | ICD-10-CM | POA: Diagnosis not present

## 2023-06-27 DIAGNOSIS — K589 Irritable bowel syndrome without diarrhea: Secondary | ICD-10-CM | POA: Diagnosis not present

## 2023-06-27 DIAGNOSIS — E876 Hypokalemia: Secondary | ICD-10-CM | POA: Diagnosis not present

## 2023-06-27 DIAGNOSIS — F32A Depression, unspecified: Secondary | ICD-10-CM | POA: Diagnosis not present

## 2023-06-27 DIAGNOSIS — D72829 Elevated white blood cell count, unspecified: Secondary | ICD-10-CM | POA: Diagnosis not present

## 2023-06-27 DIAGNOSIS — Z9049 Acquired absence of other specified parts of digestive tract: Secondary | ICD-10-CM | POA: Diagnosis not present

## 2023-07-08 ENCOUNTER — Other Ambulatory Visit (HOSPITAL_COMMUNITY): Payer: Self-pay | Admitting: Surgery

## 2023-07-08 DIAGNOSIS — Z933 Colostomy status: Secondary | ICD-10-CM

## 2023-07-08 DIAGNOSIS — Z9049 Acquired absence of other specified parts of digestive tract: Secondary | ICD-10-CM

## 2023-07-11 DIAGNOSIS — D72829 Elevated white blood cell count, unspecified: Secondary | ICD-10-CM | POA: Diagnosis not present

## 2023-07-11 DIAGNOSIS — I700 Atherosclerosis of aorta: Secondary | ICD-10-CM | POA: Diagnosis not present

## 2023-07-11 DIAGNOSIS — Z9181 History of falling: Secondary | ICD-10-CM | POA: Diagnosis not present

## 2023-07-11 DIAGNOSIS — Z48815 Encounter for surgical aftercare following surgery on the digestive system: Secondary | ICD-10-CM | POA: Diagnosis not present

## 2023-07-11 DIAGNOSIS — S2241XD Multiple fractures of ribs, right side, subsequent encounter for fracture with routine healing: Secondary | ICD-10-CM | POA: Diagnosis not present

## 2023-07-11 DIAGNOSIS — F32A Depression, unspecified: Secondary | ICD-10-CM | POA: Diagnosis not present

## 2023-07-11 DIAGNOSIS — N321 Vesicointestinal fistula: Secondary | ICD-10-CM | POA: Diagnosis not present

## 2023-07-11 DIAGNOSIS — G4700 Insomnia, unspecified: Secondary | ICD-10-CM | POA: Diagnosis not present

## 2023-07-11 DIAGNOSIS — N400 Enlarged prostate without lower urinary tract symptoms: Secondary | ICD-10-CM | POA: Diagnosis not present

## 2023-07-11 DIAGNOSIS — I10 Essential (primary) hypertension: Secondary | ICD-10-CM | POA: Diagnosis not present

## 2023-07-11 DIAGNOSIS — E876 Hypokalemia: Secondary | ICD-10-CM | POA: Diagnosis not present

## 2023-07-11 DIAGNOSIS — K589 Irritable bowel syndrome without diarrhea: Secondary | ICD-10-CM | POA: Diagnosis not present

## 2023-07-11 DIAGNOSIS — Z933 Colostomy status: Secondary | ICD-10-CM | POA: Diagnosis not present

## 2023-07-11 DIAGNOSIS — Z9049 Acquired absence of other specified parts of digestive tract: Secondary | ICD-10-CM | POA: Diagnosis not present

## 2023-07-11 DIAGNOSIS — K769 Liver disease, unspecified: Secondary | ICD-10-CM | POA: Diagnosis not present

## 2023-07-29 ENCOUNTER — Ambulatory Visit (HOSPITAL_COMMUNITY)
Admission: RE | Admit: 2023-07-29 | Discharge: 2023-07-29 | Disposition: A | Discharge status: Home / Self Care | Source: Ambulatory Visit | Attending: Surgery | Admitting: Surgery

## 2023-07-29 DIAGNOSIS — Z9049 Acquired absence of other specified parts of digestive tract: Secondary | ICD-10-CM | POA: Diagnosis not present

## 2023-07-29 DIAGNOSIS — Z933 Colostomy status: Secondary | ICD-10-CM

## 2023-07-29 DIAGNOSIS — K828 Other specified diseases of gallbladder: Secondary | ICD-10-CM | POA: Diagnosis not present

## 2023-07-29 MED ORDER — IOHEXOL 300 MG/ML  SOLN
200.0000 mL | Freq: Once | INTRAMUSCULAR | Status: AC | PRN
  Administered 2023-07-29: 200 mL

## 2023-08-08 DIAGNOSIS — K5792 Diverticulitis of intestine, part unspecified, without perforation or abscess without bleeding: Secondary | ICD-10-CM | POA: Diagnosis not present

## 2023-08-09 DIAGNOSIS — K56609 Unspecified intestinal obstruction, unspecified as to partial versus complete obstruction: Secondary | ICD-10-CM | POA: Diagnosis not present

## 2023-08-09 DIAGNOSIS — Z933 Colostomy status: Secondary | ICD-10-CM | POA: Diagnosis not present

## 2023-08-20 ENCOUNTER — Other Ambulatory Visit: Payer: Self-pay | Admitting: Family Medicine

## 2023-08-20 DIAGNOSIS — M7989 Other specified soft tissue disorders: Secondary | ICD-10-CM | POA: Diagnosis not present

## 2023-08-21 ENCOUNTER — Ambulatory Visit
Admission: RE | Admit: 2023-08-21 | Discharge: 2023-08-21 | Disposition: A | Discharge status: Home / Self Care | Source: Ambulatory Visit | Attending: Family Medicine | Admitting: Family Medicine

## 2023-08-21 DIAGNOSIS — M7989 Other specified soft tissue disorders: Secondary | ICD-10-CM

## 2023-08-21 DIAGNOSIS — I82611 Acute embolism and thrombosis of superficial veins of right upper extremity: Secondary | ICD-10-CM | POA: Diagnosis not present

## 2023-08-21 DIAGNOSIS — R600 Localized edema: Secondary | ICD-10-CM | POA: Diagnosis not present

## 2023-09-03 DIAGNOSIS — L309 Dermatitis, unspecified: Secondary | ICD-10-CM | POA: Diagnosis not present

## 2023-09-06 DIAGNOSIS — K56609 Unspecified intestinal obstruction, unspecified as to partial versus complete obstruction: Secondary | ICD-10-CM | POA: Diagnosis not present

## 2023-09-06 DIAGNOSIS — Z933 Colostomy status: Secondary | ICD-10-CM | POA: Diagnosis not present

## 2023-09-09 DIAGNOSIS — Z933 Colostomy status: Secondary | ICD-10-CM | POA: Diagnosis not present

## 2023-09-09 DIAGNOSIS — K56609 Unspecified intestinal obstruction, unspecified as to partial versus complete obstruction: Secondary | ICD-10-CM | POA: Diagnosis not present

## 2023-10-18 DIAGNOSIS — K56609 Unspecified intestinal obstruction, unspecified as to partial versus complete obstruction: Secondary | ICD-10-CM | POA: Diagnosis not present

## 2023-10-18 DIAGNOSIS — Z933 Colostomy status: Secondary | ICD-10-CM | POA: Diagnosis not present

## 2023-10-28 DIAGNOSIS — Z933 Colostomy status: Secondary | ICD-10-CM | POA: Diagnosis not present

## 2023-11-04 ENCOUNTER — Other Ambulatory Visit: Payer: Self-pay | Admitting: Urology

## 2023-11-12 NOTE — Progress Notes (Signed)
 Sent message, via epic in basket, requesting orders in epic from Careers adviser.

## 2023-11-13 ENCOUNTER — Ambulatory Visit: Payer: Self-pay | Admitting: Surgery

## 2023-11-13 DIAGNOSIS — K5732 Diverticulitis of large intestine without perforation or abscess without bleeding: Secondary | ICD-10-CM | POA: Diagnosis not present

## 2023-11-13 DIAGNOSIS — Z01818 Encounter for other preprocedural examination: Secondary | ICD-10-CM

## 2023-11-13 DIAGNOSIS — K5730 Diverticulosis of large intestine without perforation or abscess without bleeding: Secondary | ICD-10-CM | POA: Diagnosis not present

## 2023-11-18 NOTE — Patient Instructions (Signed)
 SURGICAL WAITING ROOM VISITATION  Patients having surgery or a procedure may have no more than 2 support people in the waiting area - these visitors may rotate.    Children under the age of 12 must have an adult with them who is not the patient.  Visitors with respiratory illnesses are discouraged from visiting and should remain at home.  If the patient needs to stay at the hospital during part of their recovery, the visitor guidelines for inpatient rooms apply. Pre-op nurse will coordinate an appropriate time for 1 support person to accompany patient in pre-op.  This support person may not rotate.    Please refer to the [facility] website for the visitor guidelines for Inpatients (after your surgery is over and you are in a regular room).       Your procedure is scheduled on: 11/28/23   Report to [facility] Main Entrance    Report to admitting at  6:45 AM   Call this number if you have problems the morning of surgery [phone]   Do not eat food :After Midnight.   After Midnight you may have the following liquids until 6 AM DAY OF SURGERY  Water  Non-Citrus Juices (without pulp, NO RED-Apple, White grape, White cranberry) Black Coffee (NO MILK/CREAM OR CREAMERS, sugar ok)  Clear Tea (NO MILK/CREAM OR CREAMERS, sugar ok) regular and decaf                             Plain Jell-O (NO RED)                                           Fruit ices (not with fruit pulp, NO RED)                                     Popsicles (NO RED)                                                               Sports drinks like Gatorade (NO RED)              Drink 2 Ensure/G2 drinks AT 10:00 PM the night before surgery.        The day of surgery:  Drink ONE (1) Pre-Surgery Clear Ensure at 6 AM the morning of surgery. Drink in one sitting. Do not sip.  This drink was given to you during your hospital  pre-op appointment visit. Nothing else to drink after completing the  Pre-Surgery Clear  Ensure.          If you have questions, please contact your surgeon's office.   FOLLOW BOWEL PREP AND ANY ADDITIONAL PRE OP INSTRUCTIONS YOU RECEIVED FROM YOUR SURGEON'S OFFICE!!!     Oral Hygiene is also important to reduce your risk of infection.                                    Remember - BRUSH YOUR TEETH THE MORNING OF SURGERY  WITH YOUR REGULAR TOOTHPASTE  DENTURES WILL BE REMOVED PRIOR TO SURGERY PLEASE DO NOT APPLY Poly grip OR ADHESIVES!!!   Stop all vitamins and herbal supplements 7 days before surgery.   Take these medicines the morning of surgery with A SIP OF WATER : Alprazolam(Xanax),amlodipine (Norvasc ),Atorvastatin,finasteride (proscar ),fluoxetine (prozac ),tamsulosin ,tylenol  if needed.             You may not have any metal on your body including hair pins, jewelry, and body piercing             Do not wear make-up, lotions, powders, perfumes/cologne, or deodorant              Men may shave face and neck.   Do not bring valuables to the hospital. [facility] IS NOT             RESPONSIBLE   FOR VALUABLES.   Contacts, glasses, dentures or bridgework may not be worn into surgery.   Bring small overnight bag day of surgery.   DO NOT BRING YOUR HOME MEDICATIONS TO THE HOSPITAL. PHARMACY WILL DISPENSE MEDICATIONS LISTED ON YOUR MEDICATION LIST TO YOU DURING YOUR ADMISSION IN THE HOSPITAL!    Patients discharged on the day of surgery will not be allowed to drive home.  Someone NEEDS to stay with you for the first 24 hours after anesthesia.   Special Instructions: Bring a copy of your healthcare power of attorney and living will documents the day of surgery if you haven't scanned them before.              Please read over the following fact sheets you were given: IF YOU HAVE QUESTIONS ABOUT YOUR PRE-OP INSTRUCTIONS PLEASE CALL [phone] [place].   If you received a COVID test during your pre-op visit  it is requested that you wear a mask when out in public, stay away from  anyone that may not be feeling well and notify your surgeon if you develop symptoms. If you test positive for Covid or have been in contact with anyone that has tested positive in the last 10 days please notify you surgeon.    [facility] - Preparing for Surgery Before surgery, you can play an important role.  Because skin is not sterile, your skin needs to be as free of germs as possible.  You can reduce the number of germs on your skin by washing with CHG (chlorahexidine gluconate) soap before surgery.  CHG is an antiseptic cleaner which kills germs and bonds with the skin to continue killing germs even after washing. Please DO NOT use if you have an allergy to CHG or antibacterial soaps.  If your skin becomes reddened/irritated stop using the CHG and inform your nurse when you arrive at Short Stay. Do not shave (including legs and underarms) for at least 48 hours prior to the first CHG shower.  You may shave your face/neck.  Please follow these instructions carefully:  1.  Shower with CHG Soap the night before surgery and the  morning of surgery.  2.  If you choose to wash your hair, wash your hair first as usual with your normal  shampoo.  3.  After you shampoo, rinse your hair and body thoroughly to remove the shampoo.                             4.  Use CHG as you would any other liquid soap.  You can apply chg directly to the  skin and wash.  Gently with a scrungie or clean washcloth.  5.  Apply the CHG Soap to your body ONLY FROM THE NECK DOWN.   Do   not use on face/ open                           Wound or open sores. Avoid contact with eyes, ears mouth and   genitals (private parts).                       Wash face,  Genitals (private parts) with your normal soap.             6.  Wash thoroughly, paying special attention to the area where your    surgery  will be performed.  7.  Thoroughly rinse your body with warm water  from the neck down.  8.  DO NOT shower/wash with your normal soap  after using and rinsing off the CHG Soap.                9.  Pat yourself dry with a clean towel.            10.  Wear clean pajamas.            11.  Place clean sheets on your bed the night of your first shower and do not  sleep with pets. Day of Surgery : Do not apply any lotions/deodorants the morning of surgery.  Please wear clean clothes to the hospital/surgery center.  FAILURE TO FOLLOW THESE INSTRUCTIONS MAY RESULT IN THE CANCELLATION OF YOUR SURGERY   Incentive Spirometer   An incentive spirometer is a tool that can help keep your lungs clear and active. This tool measures how well you are filling your lungs with each breath. Taking long deep breaths may help reverse or decrease the chance of developing breathing (pulmonary) problems (especially infection) following: A long period of time when you are unable to move or be active. BEFORE THE PROCEDURE  If the spirometer includes an indicator to show your best effort, your nurse or respiratory therapist will set it to a desired goal. If possible, sit up straight or lean slightly forward. Try not to slouch. Hold the incentive spirometer in an upright position. INSTRUCTIONS FOR USE  Sit on the edge of your bed if possible, or sit up as far as you can in bed or on a chair. Hold the incentive spirometer in an upright position. Breathe out normally. Place the mouthpiece in your mouth and seal your lips tightly around it. Breathe in slowly and as deeply as possible, raising the piston or the ball toward the top of the column. Hold your breath for 3-5 seconds or for as long as possible. Allow the piston or ball to fall to the bottom of the column. Remove the mouthpiece from your mouth and breathe out normally. Rest for a few seconds and repeat Steps 1 through 7 at least 10 times every 1-2 hours when you are awake. Take your time and take a few normal breaths between deep breaths. The spirometer may include an indicator to show your best  effort. Use the indicator as a goal to work toward during each repetition. After each set of 10 deep breaths, practice coughing to be sure your lungs are clear. If you have an incision (the cut made at the time of surgery), support your incision when coughing by placing a pillow  or rolled up towels firmly against it. Once you are able to get out of bed, walk around indoors and cough well. You may stop using the incentive spirometer when instructed by your caregiver.  RISKS AND COMPLICATIONS Take your time so you do not get dizzy or light-headed. If you are in pain, you may need to take or ask for pain medication before doing incentive spirometry. It is harder to take a deep breath if you are having pain. AFTER USE Rest and breathe slowly and easily. It can be helpful to keep track of a log of your progress. Your caregiver can provide you with a simple table to help with this. If you are using the spirometer at home, follow these instructions: SEEK MEDICAL CARE IF:  You are having difficultly using the spirometer. You have trouble using the spirometer as often as instructed. Your pain medication is not giving enough relief while using the spirometer. You develop fever of 100.5 F (38.1 C) or higher. SEEK IMMEDIATE MEDICAL CARE IF:  You cough up bloody sputum that had not been present before. You develop fever of 102 F (38.9 C) or greater. You develop worsening pain at or near the incision site. MAKE SURE YOU:  Understand these instructions. Will watch your condition. Will get help right away if you are not doing well or get worse. Document Released: 07/30/2006 Document Revised: 06/11/2011 Document Reviewed: 09/30/2006   WHAT IS A BLOOD TRANSFUSION? Blood Transfusion Information  A transfusion is the replacement of blood or some of its parts. Blood is made up of multiple cells which provide different functions. Red blood cells carry oxygen and are used for blood loss replacement. White  blood cells fight against infection. Platelets control bleeding. Plasma helps clot blood. Other blood products are available for specialized needs, such as hemophilia or other clotting disorders. BEFORE THE TRANSFUSION  Who gives blood for transfusions?  Healthy volunteers who are fully evaluated to make sure their blood is safe. This is blood bank blood. Transfusion therapy is the safest it has ever been in the practice of medicine. Before blood is taken from a donor, a complete history is taken to make sure that person has no history of diseases nor engages in risky social behavior (examples are intravenous drug use or sexual activity with multiple partners). The donor's travel history is screened to minimize risk of transmitting infections, such as malaria. The donated blood is tested for signs of infectious diseases, such as HIV and hepatitis. The blood is then tested to be sure it is compatible with you in order to minimize the chance of a transfusion reaction. If you or a relative donates blood, this is often done in anticipation of surgery and is not appropriate for emergency situations. It takes many days to process the donated blood. RISKS AND COMPLICATIONS Although transfusion therapy is very safe and saves many lives, the main dangers of transfusion include:  Getting an infectious disease. Developing a transfusion reaction. This is an allergic reaction to something in the blood you were given. Every precaution is taken to prevent this. The decision to have a blood transfusion has been considered carefully by your caregiver before blood is given. Blood is not given unless the benefits outweigh the risks. AFTER THE TRANSFUSION Right after receiving a blood transfusion, you will usually feel much better and more energetic. This is especially true if your red blood cells have gotten low (anemic). The transfusion raises the level of the red blood cells which carry  oxygen, and this usually causes  an energy increase. The nurse administering the transfusion will monitor you carefully for complications. HOME CARE INSTRUCTIONS  No special instructions are needed after a transfusion. You may find your energy is better. Speak with your caregiver about any limitations on activity for underlying diseases you may have. SEEK MEDICAL CARE IF:  Your condition is not improving after your transfusion. You develop redness or irritation at the intravenous (IV) site. SEEK IMMEDIATE MEDICAL CARE IF:  Any of the following symptoms occur over the next 12 hours: Shaking chills. You have a temperature by mouth above 102 F (38.9 C), not controlled by medicine. Chest, back, or muscle pain. People around you feel you are not acting correctly or are confused. Shortness of breath or difficulty breathing. Dizziness and fainting. You get a rash or develop hives. You have a decrease in urine output. Your urine turns a dark color or changes to pink, red, or brown. Any of the following symptoms occur over the next 10 days: You have a temperature by mouth above 102 F (38.9 C), not controlled by medicine. Shortness of breath. Weakness after normal activity. The white part of the eye turns yellow (jaundice). You have a decrease in the amount of urine or are urinating less often. Your urine turns a dark color or changes to pink, red, or brown. Document Released: 03/16/2000 Document Revised: 06/11/2011 Document Reviewed: 11/03/2007 [facility] Patient Information 2014 [place], [place].

## 2023-11-18 NOTE — Progress Notes (Signed)
 COVID Vaccine received:  []  No [x]  Yes Date of any COVID positive Test in last 90 days: no PCP - Dibas Koirala MD Cardiologist - n/a  Chest x-ray -  EKG -  06/09/23 Epic Stress Test -  ECHO - 02/15/23 Epic Cardiac Cath -   Bowel Prep - [x]  No  []   Yes ______  Pacemaker / ICD device [x]  No []  Yes   Spinal Cord Stimulator:[x]  No []  Yes       History of Sleep Apnea? [x]  No []  Yes   CPAP used?- [x]  No []  Yes    Does the patient monitor blood sugar?          [x]  No []  Yes  []  N/A  Patient has: [x]  NO Hx DM   []  Pre-DM                 []  DM1  []   DM2 Does patient have a [employer] or Dexacom? []  No []  Yes   Fasting Blood Sugar Ranges-  Checks Blood Sugar _____ times a day  GLP1 agonist / usual dose - no GLP1 instructions:  SGLT-2 inhibitors / usual dose - no SGLT-2 instructions:   Blood Thinner / Instructions:no Aspirin Instructions:81 mg ASA will stop 5-7 days prior to surgery.  Comments:   Activity level: Patient is able  to climb a flight of stairs without difficulty; [x]  No CP  [x]  No SOB,   Patient can perform ADLs without assistance.   Anesthesia review:   Patient denies shortness of breath, fever, cough and chest pain at PAT appointment.  Patient verbalized understanding and agreement to the Pre-Surgical Instructions that were given to them at this PAT appointment. Patient was also educated of the need to review these PAT instructions again prior to his/her surgery.I reviewed the appropriate phone numbers to call if they have any and questions or concerns.

## 2023-11-19 ENCOUNTER — Other Ambulatory Visit: Payer: Self-pay

## 2023-11-19 ENCOUNTER — Encounter (HOSPITAL_COMMUNITY)
Admission: RE | Admit: 2023-11-19 | Discharge: 2023-11-19 | Disposition: A | Discharge status: Home / Self Care | Source: Ambulatory Visit | Attending: Surgery

## 2023-11-19 ENCOUNTER — Encounter (HOSPITAL_COMMUNITY): Payer: Self-pay

## 2023-11-19 DIAGNOSIS — Z01818 Encounter for other preprocedural examination: Secondary | ICD-10-CM | POA: Diagnosis not present

## 2023-11-19 DIAGNOSIS — Z01812 Encounter for preprocedural laboratory examination: Secondary | ICD-10-CM | POA: Diagnosis not present

## 2023-11-19 HISTORY — DX: Unspecified osteoarthritis, unspecified site: M19.90

## 2023-11-19 LAB — CBC WITH DIFFERENTIAL/PLATELET
Abs Immature Granulocytes: 0.04 K/uL (ref 0.00–0.07)
Basophils Absolute: 0.1 K/uL (ref 0.0–0.1)
Basophils Relative: 1 %
Eosinophils Absolute: 0.3 K/uL (ref 0.0–0.5)
Eosinophils Relative: 4 %
HCT: 45 % (ref 39.0–52.0)
Hemoglobin: 14.4 g/dL (ref 13.0–17.0)
Immature Granulocytes: 1 %
Lymphocytes Relative: 20 %
Lymphs Abs: 1.3 K/uL (ref 0.7–4.0)
MCH: 29.2 pg (ref 26.0–34.0)
MCHC: 32 g/dL (ref 30.0–36.0)
MCV: 91.3 fL (ref 80.0–100.0)
Monocytes Absolute: 0.6 K/uL (ref 0.1–1.0)
Monocytes Relative: 9 %
Neutro Abs: 4.2 K/uL (ref 1.7–7.7)
Neutrophils Relative %: 65 %
Platelets: 221 K/uL (ref 150–400)
RBC: 4.93 MIL/uL (ref 4.22–5.81)
RDW: 13.8 % (ref 11.5–15.5)
WBC: 6.6 K/uL (ref 4.0–10.5)
nRBC: 0 % (ref 0.0–0.2)

## 2023-11-19 LAB — COMPREHENSIVE METABOLIC PANEL WITH GFR
ALT: 14 U/L (ref 0–44)
AST: 18 U/L (ref 15–41)
Albumin: 4 g/dL (ref 3.5–5.0)
Alkaline Phosphatase: 64 U/L (ref 38–126)
Anion gap: 8 (ref 5–15)
BUN: 19 mg/dL (ref 8–23)
CO2: 21 mmol/L — ABNORMAL LOW (ref 22–32)
Calcium: 9.3 mg/dL (ref 8.9–10.3)
Chloride: 109 mmol/L (ref 98–111)
Creatinine, Ser: 0.79 mg/dL (ref 0.61–1.24)
GFR, Estimated: >60 mL/min (ref >60)
Glucose, Bld: 95 mg/dL (ref 70–99)
Potassium: 3.9 mmol/L (ref 3.5–5.1)
Sodium: 138 mmol/L (ref 135–145)
Total Bilirubin: 1 mg/dL (ref 0.0–1.2)
Total Protein: 7.7 g/dL (ref 6.5–8.1)

## 2023-11-19 NOTE — Progress Notes (Signed)
 COVID Vaccine received:  []  No []  Yes Date of any COVID positive Test in last 90 days:  PCP - Dibas Koirala MD Cardiologist -   Chest x-ray -  EKG -  06/09/23 Epic Stress Test -  ECHO - 02/15/23 Epic Cardiac Cath -   Bowel Prep - []  No  []   Yes ______  Pacemaker / ICD device []  No []  Yes   Spinal Cord Stimulator:[]  No []  Yes       History of Sleep Apnea? []  No []  Yes   CPAP used?- []  No []  Yes    Does the patient monitor blood sugar?          []  No []  Yes  []  N/A  Patient has: []  NO Hx DM   []  Pre-DM                 []  DM1  []   DM2 Does patient have a [employer] or [employer]? []  No []  Yes   Fasting Blood Sugar Ranges-  Checks Blood Sugar _____ times a day  GLP1 agonist / usual dose -  GLP1 instructions:  SGLT-2 inhibitors / usual dose -  SGLT-2 instructions:   Blood Thinner / Instructions: Aspirin Instructions:  Comments:   Activity level: Patient is able / unable to climb a flight of stairs without difficulty; []  No CP  []  No SOB, but would have ___   Patient can / can not perform ADLs without assistance.   Anesthesia review:   Patient denies shortness of breath, fever, cough and chest pain at PAT appointment.  Patient verbalized understanding and agreement to the Pre-Surgical Instructions that were given to them at this PAT appointment. Patient was also educated of the need to review these PAT instructions again prior to his/her surgery.I reviewed the appropriate phone numbers to call if they have any and questions or concerns.

## 2023-11-27 NOTE — Anesthesia Preprocedure Evaluation (Signed)
 Anesthesia Evaluation  Patient identified by MRN, date of birth, ID band Patient awake    Reviewed: Allergy & Precautions, NPO status , Patient's Chart, lab work & pertinent test results  History of Anesthesia Complications Negative for: history of anesthetic complications  Airway Mallampati: III  TM Distance: >3 FB Neck ROM: Full   Comment: Previous grade I view with [facility]  (+) Dental Advisory Given, Missing Only has 1 tooth on the top and some on the bottom. Denies loose teeth.:   Pulmonary neg shortness of breath, sleep apnea (does not use CPAP) , neg COPD, neg recent URI, former smoker   Pulmonary exam normal breath sounds clear to auscultation       Cardiovascular hypertension (amlodipine ), Pt. on medications (-) angina (-) Past MI, (-) Cardiac Stents and (-) CABG + dysrhythmias (prolonged QT, PACs, bigeminy)  Rhythm:Regular Rate:Normal  HLD  TTE 02/15/2023: IMPRESSIONS    1. Left ventricular ejection fraction, by estimation, is 65 to 70%. The  left ventricle has normal function. The left ventricle has no regional  wall motion abnormalities. Left ventricular diastolic parameters are  consistent with Grade I diastolic  dysfunction (impaired relaxation).   2. Right ventricular systolic function is normal. The right ventricular  size is normal.   3. Left atrial size was mildly dilated.   4. The mitral valve is normal in structure. Trivial mitral valve  regurgitation. No evidence of mitral stenosis.   5. The aortic valve is calcified. There is mild calcification of the  aortic valve. Aortic valve regurgitation is trivial. Aortic valve  sclerosis/calcification is present, without any evidence of aortic  stenosis. Aortic valve area, by VTI measures 2.49  cm. Aortic valve mean gradient measures 7.0 mmHg. Aortic valve Vmax  measures 1.88 m/s.     Neuro/Psych  PSYCHIATRIC DISORDERS Anxiety Depression    negative  neurological ROS     GI/Hepatic negative GI ROS, Neg liver ROS,,,  Endo/Other  neg diabetes    Renal/GU negative Renal ROS     Musculoskeletal  (+) Arthritis ,    Abdominal   Peds  Hematology Lab Results      Component                Value               Date                      WBC                      6.6                 11/19/2023                HGB                      14.4                11/19/2023                HCT                      45.0                11/19/2023                MCV  91.3                11/19/2023                PLT                      221                 11/19/2023              Anesthesia Other Findings   Reproductive/Obstetrics                              Anesthesia Physical Anesthesia Plan  ASA: 3  Anesthesia Plan: General   Post-op Pain Management: Tylenol  PO (pre-op)*   Induction: Intravenous  PONV Risk Score and Plan: 2 and Ondansetron , Dexamethasone  and Treatment may vary due to age or medical condition  Airway Management Planned: Oral ETT  Additional Equipment:   Intra-op Plan:   Post-operative Plan: Extubation in OR  Informed Consent: I have reviewed the patients History and Physical, chart, labs and discussed the procedure including the risks, benefits and alternatives for the proposed anesthesia with the patient or authorized representative who has indicated his/her understanding and acceptance.     Dental advisory given  Plan Discussed with: CRNA and Anesthesiologist  Anesthesia Plan Comments: (Reports getting pain medicine, anti-anxiety medicine, and a muscle relaxer when he had his colectomy and had delusions for 12 hours after. Discussed giving pain medicine but that we could avoid some of the other medicines.  Risks of general anesthesia discussed including, but not limited to, sore throat, hoarse voice, chipped/damaged teeth, injury to vocal cords, nausea and vomiting,  allergic reactions, lung infection, heart attack, stroke, and death. All questions answered. )         Anesthesia Quick Evaluation

## 2023-11-27 NOTE — H&P (Signed)
 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 Here today for colostomy take down. Dr. Hezekiah team has requested preoperative firefly instillation.   Past Medical History:  Diagnosis Date   Anxiety    Arthritis    COVID-19    Diverticulitis    Dizziness    ED (erectile dysfunction) of non-organic origin    Enlarged prostate without lower urinary tract symptoms (luts)    Fluctuating blood pressure    History of BPH    HTN (hypertension)    Hypercholesteremia    Impaired fasting glucose    Memory changes    Moderate major depression (HCC)    Recurrent low back pain    Restless leg    Sleep disorder    Tremor    TSH deficiency    Past Surgical History:  Procedure Laterality Date   FLEXIBLE SIGMOIDOSCOPY N/A 06/03/2023   Procedure: KINGSTON SIDE;  Surgeon: Kriss Estefana DEL, DO;  Location: [facility];  Service: Gastroenterology;  Laterality: N/A;   PARTIAL COLECTOMY N/A 06/04/2023   Procedure: Exploratory laparotomy with sigmoid colectomy and end colostomy (Hartmann's procedure);  Surgeon: Teresa Lonni HERO, MD;  Location: [facility];  Service: General;  Laterality: N/A;    Home Medications:  No medications prior to admission.   Allergies:  Allergies  Allergen Reactions   Buspar [Buspirone]     EXACERBATED IBS    Crestor [Rosuvastatin]     BODY ACHES   Erythromycin     GI DISTRESS   Lipitor [Atorvastatin ]     JOINT PAIN    Lovastatin     MUSCLE ACHES    Pravachol [Pravastatin]     MM WEAKNESS   Wellbutrin [Bupropion] Other (See Comments)    unknown reaction    No family history on file. Social History:  reports that he has quit smoking. His smoking use included cigarettes. He does not have any smokeless tobacco history on file. He reports that he does not drink alcohol  and does not use drugs.  ROS: A  complete review of systems was performed.  All systems are negative except for pertinent findings as noted. ROS   Physical Exam:  Vital signs in last 24 hours:   General:  Alert and oriented, No acute distress HEENT: Normocephalic, atraumatic Neck: No JVD or lymphadenopathy Cardiovascular: Regular rate and rhythm Lungs: Regular rate and effort Abdomen: Soft, nontender, nondistended, no abdominal masses Back: No CVA tenderness Extremities: No edema Neurologic: Grossly intact  Laboratory Data:  No results found for this or any previous visit (from the past 24 hours). No results found for this or any previous visit (from the past 240 hours). Creatinine: No results for input(s): CREATININE in the last 168 hours.  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 Here today for colostomy take down. Dr. Hezekiah team has requested preoperative firefly instillation.  We discussed risk benefits alternatives to [facility].  This included bleeding infection and damage to surrounding structures surrounding structures including ureter as well as urethra.  We discussed the need for stent postoperatively as well as the potential symptoms of stent placement.  Patient voiced their understanding and consent was obtained.     Plan:  Proceed with firefly instillation   Steffan JAYSON Pea 11/27/2023, 6:45 PM

## 2023-11-28 ENCOUNTER — Other Ambulatory Visit: Payer: Self-pay

## 2023-11-28 ENCOUNTER — Inpatient Hospital Stay (HOSPITAL_COMMUNITY)

## 2023-11-28 ENCOUNTER — Encounter (HOSPITAL_COMMUNITY): Payer: Self-pay | Admitting: Surgery

## 2023-11-28 ENCOUNTER — Inpatient Hospital Stay (HOSPITAL_COMMUNITY): Payer: Self-pay | Admitting: Anesthesiology

## 2023-11-28 ENCOUNTER — Encounter (HOSPITAL_COMMUNITY): Payer: Self-pay | Admitting: Anesthesiology

## 2023-11-28 ENCOUNTER — Inpatient Hospital Stay (HOSPITAL_COMMUNITY)
Admission: RE | Admit: 2023-11-28 | Discharge: 2023-12-01 | DRG: 330 | Disposition: A | Discharge status: Home / Self Care | Attending: Surgery | Admitting: Surgery

## 2023-11-28 ENCOUNTER — Encounter (HOSPITAL_COMMUNITY)
Admission: RE | Disposition: A | Discharge status: Home / Self Care | Payer: Self-pay | Source: Home / Self Care | Attending: Surgery

## 2023-11-28 DIAGNOSIS — Z79899 Other long term (current) drug therapy: Secondary | ICD-10-CM | POA: Diagnosis not present

## 2023-11-28 DIAGNOSIS — Z87891 Personal history of nicotine dependence: Secondary | ICD-10-CM

## 2023-11-28 DIAGNOSIS — E7800 Pure hypercholesterolemia, unspecified: Secondary | ICD-10-CM | POA: Diagnosis not present

## 2023-11-28 DIAGNOSIS — Z881 Allergy status to other antibiotic agents status: Secondary | ICD-10-CM

## 2023-11-28 DIAGNOSIS — Z408 Encounter for other prophylactic surgery: Secondary | ICD-10-CM | POA: Diagnosis not present

## 2023-11-28 DIAGNOSIS — Z888 Allergy status to other drugs, medicaments and biological substances status: Secondary | ICD-10-CM | POA: Diagnosis not present

## 2023-11-28 DIAGNOSIS — I10 Essential (primary) hypertension: Secondary | ICD-10-CM

## 2023-11-28 DIAGNOSIS — G2581 Restless legs syndrome: Secondary | ICD-10-CM | POA: Diagnosis not present

## 2023-11-28 DIAGNOSIS — K660 Peritoneal adhesions (postprocedural) (postinfection): Secondary | ICD-10-CM | POA: Diagnosis not present

## 2023-11-28 DIAGNOSIS — K567 Ileus, unspecified: Secondary | ICD-10-CM | POA: Diagnosis not present

## 2023-11-28 DIAGNOSIS — F321 Major depressive disorder, single episode, moderate: Secondary | ICD-10-CM | POA: Diagnosis not present

## 2023-11-28 DIAGNOSIS — K5792 Diverticulitis of intestine, part unspecified, without perforation or abscess without bleeding: Secondary | ICD-10-CM | POA: Diagnosis not present

## 2023-11-28 DIAGNOSIS — Z9889 Other specified postprocedural states: Principal | ICD-10-CM

## 2023-11-28 DIAGNOSIS — Z933 Colostomy status: Secondary | ICD-10-CM | POA: Diagnosis not present

## 2023-11-28 DIAGNOSIS — Z433 Encounter for attention to colostomy: Principal | ICD-10-CM

## 2023-11-28 DIAGNOSIS — N400 Enlarged prostate without lower urinary tract symptoms: Secondary | ICD-10-CM | POA: Diagnosis not present

## 2023-11-28 DIAGNOSIS — F419 Anxiety disorder, unspecified: Secondary | ICD-10-CM | POA: Diagnosis not present

## 2023-11-28 DIAGNOSIS — K5732 Diverticulitis of large intestine without perforation or abscess without bleeding: Secondary | ICD-10-CM | POA: Diagnosis present

## 2023-11-28 HISTORY — PX: XI ROBOTIC ASSISTED COLOSTOMY TAKEDOWN: SHX6828

## 2023-11-28 HISTORY — PX: FLEXIBLE SIGMOIDOSCOPY: SHX5431

## 2023-11-28 HISTORY — PX: ROBOTIC ASSISTED LAPAROSCOPIC LYSIS OF ADHESION: SHX6080

## 2023-11-28 HISTORY — PX: CYSTOSCOPY WITH INDOCYANINE GREEN IMAGING (ICG): SHX7549

## 2023-11-28 LAB — TYPE AND SCREEN
ABO/RH(D): O POS
Antibody Screen: NEGATIVE

## 2023-11-28 SURGERY — CLOSURE, COLOSTOMY, ROBOT-ASSISTED
Anesthesia: General

## 2023-11-28 MED ORDER — METRONIDAZOLE 500 MG PO TABS: 1000.0000 mg | ORAL_TABLET | ORAL | Status: DC

## 2023-11-28 MED ORDER — LACTATED RINGERS IV SOLN: INTRAVENOUS | Status: DC

## 2023-11-28 MED ORDER — ALUM & MAG HYDROXIDE-SIMETH 200-200-20 MG/5ML PO SUSP: 30.0000 mL | Freq: Four times a day (QID) | ORAL | Status: DC | PRN

## 2023-11-28 MED ORDER — CARBOXYMETHYLCELLULOSE SODIUM 0.5 % OP SOLN: 1.0000 [drp] | Freq: Three times a day (TID) | OPHTHALMIC | Status: DC | PRN

## 2023-11-28 MED ORDER — IOHEXOL 300 MG/ML  SOLN
INTRAMUSCULAR | Status: DC | PRN
  Administered 2023-11-28: 10 mL via URETHRAL

## 2023-11-28 MED ORDER — FENTANYL CITRATE (PF) 100 MCG/2ML IJ SOLN
INTRAMUSCULAR | Status: DC | PRN
  Administered 2023-11-28 (×4): 50 ug via INTRAVENOUS

## 2023-11-28 MED ORDER — SIMETHICONE 80 MG PO CHEW
40.0000 mg | CHEWABLE_TABLET | Freq: Four times a day (QID) | ORAL | Status: DC | PRN
  Administered 2023-11-30: 40 mg via ORAL
  Filled 2023-11-28: qty 1

## 2023-11-28 MED ORDER — ROCURONIUM BROMIDE 100 MG/10ML IV SOLN
INTRAVENOUS | Status: DC | PRN
  Administered 2023-11-28: 10 mg via INTRAVENOUS
  Administered 2023-11-28: 60 mg via INTRAVENOUS
  Administered 2023-11-28: 20 mg via INTRAVENOUS

## 2023-11-28 MED ORDER — BISACODYL 5 MG PO TBEC: 20.0000 mg | DELAYED_RELEASE_TABLET | Freq: Once | ORAL | Status: DC

## 2023-11-28 MED ORDER — CHLORHEXIDINE GLUCONATE CLOTH 2 % EX PADS: 6.0000 | MEDICATED_PAD | Freq: Once | CUTANEOUS | Status: DC

## 2023-11-28 MED ORDER — ENSURE SURGERY PO LIQD: 237.0000 mL | Freq: Two times a day (BID) | ORAL | Status: DC

## 2023-11-28 MED ORDER — STERILE WATER FOR IRRIGATION IR SOLN
Status: DC | PRN
  Administered 2023-11-28: 1000 mL

## 2023-11-28 MED ORDER — TAMSULOSIN HCL 0.4 MG PO CAPS
0.8000 mg | ORAL_CAPSULE | Freq: Every day | ORAL | Status: DC
  Administered 2023-11-30: 0.8 mg via ORAL
  Filled 2023-11-28 (×3): qty 2

## 2023-11-28 MED ORDER — EPHEDRINE SULFATE-NACL 50-0.9 MG/10ML-% IV SOSY
PREFILLED_SYRINGE | INTRAVENOUS | Status: DC | PRN
  Administered 2023-11-28 (×2): 5 mg via INTRAVENOUS
  Administered 2023-11-28: 10 mg via INTRAVENOUS
  Administered 2023-11-28: 5 mg via INTRAVENOUS

## 2023-11-28 MED ORDER — ONDANSETRON HCL 4 MG/2ML IJ SOLN
INTRAMUSCULAR | Status: AC
Start: 2023-11-28 — End: 2023-11-28
  Filled 2023-11-28: qty 2

## 2023-11-28 MED ORDER — 0.9 % SODIUM CHLORIDE (POUR BTL) OPTIME
TOPICAL | Status: DC | PRN
  Administered 2023-11-28: 2000 mL

## 2023-11-28 MED ORDER — SODIUM CHLORIDE 0.9 % IV SOLN
2.0000 g | INTRAVENOUS | Status: AC
  Administered 2023-11-28: 2 g via INTRAVENOUS
  Filled 2023-11-28: qty 2

## 2023-11-28 MED ORDER — PHENYLEPHRINE HCL-NACL 20-0.9 MG/250ML-% IV SOLN
INTRAVENOUS | Status: DC | PRN
Start: 2023-11-28 — End: 2023-11-28
  Administered 2023-11-28: 80 ug via INTRAVENOUS

## 2023-11-28 MED ORDER — LACTATED RINGERS IR SOLN
Status: DC | PRN
  Administered 2023-11-28: 1000 mL

## 2023-11-28 MED ORDER — POLYVINYL ALCOHOL 1.4 % OP SOLN: 1.0000 [drp] | OPHTHALMIC | Status: DC | PRN

## 2023-11-28 MED ORDER — STERILE WATER FOR INJECTION IJ SOLN
INTRAMUSCULAR | Status: AC
  Filled 2023-11-28: qty 10

## 2023-11-28 MED ORDER — DIPHENHYDRAMINE HCL 12.5 MG/5ML PO ELIX: 12.5000 mg | ORAL_SOLUTION | Freq: Four times a day (QID) | ORAL | Status: DC | PRN

## 2023-11-28 MED ORDER — ATORVASTATIN CALCIUM 20 MG PO TABS
20.0000 mg | ORAL_TABLET | Freq: Every day | ORAL | Status: DC
  Administered 2023-11-28 – 2023-11-30 (×2): 20 mg via ORAL
  Filled 2023-11-28 (×3): qty 1

## 2023-11-28 MED ORDER — LIDOCAINE HCL 1 % IJ SOLN
INTRAMUSCULAR | Status: AC
  Filled 2023-11-28: qty 20

## 2023-11-28 MED ORDER — PROPOFOL 10 MG/ML IV BOLUS
INTRAVENOUS | Status: AC
  Filled 2023-11-28: qty 20

## 2023-11-28 MED ORDER — ALVIMOPAN 12 MG PO CAPS
12.0000 mg | ORAL_CAPSULE | ORAL | Status: AC
  Administered 2023-11-28: 12 mg via ORAL
  Filled 2023-11-28: qty 1

## 2023-11-28 MED ORDER — HYDROMORPHONE HCL 1 MG/ML IJ SOLN
0.5000 mg | INTRAMUSCULAR | Status: DC | PRN
  Administered 2023-11-29 – 2023-11-30 (×4): 0.5 mg via INTRAVENOUS
  Filled 2023-11-28 (×4): qty 0.5

## 2023-11-28 MED ORDER — OXYCODONE HCL 5 MG PO TABS: 5.0000 mg | ORAL_TABLET | Freq: Once | ORAL | Status: DC | PRN

## 2023-11-28 MED ORDER — AMISULPRIDE (ANTIEMETIC) 5 MG/2ML IV SOLN: 10.0000 mg | Freq: Once | INTRAVENOUS | Status: DC | PRN

## 2023-11-28 MED ORDER — DEXAMETHASONE SODIUM PHOSPHATE 10 MG/ML IJ SOLN
INTRAMUSCULAR | Status: AC
  Filled 2023-11-28: qty 1

## 2023-11-28 MED ORDER — FLUOXETINE HCL 20 MG PO CAPS
80.0000 mg | ORAL_CAPSULE | Freq: Every day | ORAL | Status: DC
  Administered 2023-11-29 – 2023-12-01 (×3): 80 mg via ORAL
  Filled 2023-11-28 (×3): qty 4

## 2023-11-28 MED ORDER — BUPIVACAINE-EPINEPHRINE (PF) 0.25% -1:200000 IJ SOLN
INTRAMUSCULAR | Status: DC | PRN
Start: 2023-11-28 — End: 2023-11-28
  Administered 2023-11-28: 60 mL

## 2023-11-28 MED ORDER — SUGAMMADEX SODIUM 200 MG/2ML IV SOLN
INTRAVENOUS | Status: DC | PRN
Start: 2023-11-28 — End: 2023-11-28
  Administered 2023-11-28: 200 mg via INTRAVENOUS

## 2023-11-28 MED ORDER — GLYCOPYRROLATE 0.2 MG/ML IJ SOLN
INTRAMUSCULAR | Status: DC | PRN
  Administered 2023-11-28: .2 mg via INTRAVENOUS

## 2023-11-28 MED ORDER — TRAZODONE HCL 100 MG PO TABS
100.0000 mg | ORAL_TABLET | Freq: Every day | ORAL | Status: DC
  Administered 2023-11-28 – 2023-11-30 (×3): 100 mg via ORAL
  Filled 2023-11-28 (×3): qty 1

## 2023-11-28 MED ORDER — OXYCODONE HCL 5 MG PO TABS
5.0000 mg | ORAL_TABLET | Freq: Four times a day (QID) | ORAL | Status: DC | PRN
  Administered 2023-11-28 – 2023-12-01 (×4): 5 mg via ORAL
  Filled 2023-11-28 (×5): qty 1

## 2023-11-28 MED ORDER — PROPOFOL 10 MG/ML IV BOLUS
INTRAVENOUS | Status: DC | PRN
  Administered 2023-11-28: 150 mg via INTRAVENOUS

## 2023-11-28 MED ORDER — FENTANYL CITRATE (PF) 100 MCG/2ML IJ SOLN
INTRAMUSCULAR | Status: AC
  Filled 2023-11-28: qty 2

## 2023-11-28 MED ORDER — ALVIMOPAN 12 MG PO CAPS
12.0000 mg | ORAL_CAPSULE | Freq: Two times a day (BID) | ORAL | Status: DC
Start: 2023-11-29 — End: 2023-12-01
  Administered 2023-11-29 – 2023-11-30 (×3): 12 mg via ORAL
  Filled 2023-11-28 (×5): qty 1

## 2023-11-28 MED ORDER — ONDANSETRON HCL 4 MG/2ML IJ SOLN
INTRAMUSCULAR | Status: DC | PRN
  Administered 2023-11-28: 4 mg via INTRAVENOUS

## 2023-11-28 MED ORDER — FINASTERIDE 5 MG PO TABS
5.0000 mg | ORAL_TABLET | Freq: Every day | ORAL | Status: DC
Start: 2023-11-28 — End: 2023-12-01
  Administered 2023-11-28 – 2023-11-30 (×3): 5 mg via ORAL
  Filled 2023-11-28 (×3): qty 1

## 2023-11-28 MED ORDER — LIDOCAINE HCL (CARDIAC) PF 100 MG/5ML IV SOSY
PREFILLED_SYRINGE | INTRAVENOUS | Status: DC | PRN
  Administered 2023-11-28: 100 mg via INTRAVENOUS

## 2023-11-28 MED ORDER — BUPIVACAINE-EPINEPHRINE (PF) 0.25% -1:200000 IJ SOLN
INTRAMUSCULAR | Status: AC
  Filled 2023-11-28: qty 60

## 2023-11-28 MED ORDER — HEPARIN SODIUM (PORCINE) 5000 UNIT/ML IJ SOLN
5000.0000 [IU] | Freq: Three times a day (TID) | INTRAMUSCULAR | Status: DC
  Administered 2023-11-28 – 2023-11-29 (×3): 5000 [IU] via SUBCUTANEOUS
  Filled 2023-11-28 (×8): qty 1

## 2023-11-28 MED ORDER — SODIUM CHLORIDE (PF) 0.9 % IJ SOLN
INTRAMUSCULAR | Status: DC | PRN
  Administered 2023-11-28: 10 mL

## 2023-11-28 MED ORDER — IBUPROFEN 400 MG PO TABS
600.0000 mg | ORAL_TABLET | Freq: Four times a day (QID) | ORAL | Status: DC | PRN
  Administered 2023-11-28: 600 mg via ORAL
  Filled 2023-11-28: qty 1

## 2023-11-28 MED ORDER — HYDRALAZINE HCL 20 MG/ML IJ SOLN: 10.0000 mg | INTRAMUSCULAR | Status: DC | PRN

## 2023-11-28 MED ORDER — CHLORHEXIDINE GLUCONATE 0.12 % MT SOLN
15.0000 mL | Freq: Once | OROMUCOSAL | Status: AC
  Administered 2023-11-28: 15 mL via OROMUCOSAL

## 2023-11-28 MED ORDER — DEXAMETHASONE SODIUM PHOSPHATE 10 MG/ML IJ SOLN
INTRAMUSCULAR | Status: DC | PRN
  Administered 2023-11-28: 8 mg via INTRAVENOUS

## 2023-11-28 MED ORDER — SUGAMMADEX SODIUM 200 MG/2ML IV SOLN
INTRAVENOUS | Status: AC
  Filled 2023-11-28: qty 2

## 2023-11-28 MED ORDER — AMLODIPINE BESYLATE 5 MG PO TABS
5.0000 mg | ORAL_TABLET | Freq: Every day | ORAL | Status: DC
  Administered 2023-11-29 – 2023-12-01 (×3): 5 mg via ORAL
  Filled 2023-11-28 (×3): qty 1

## 2023-11-28 MED ORDER — HEPARIN SODIUM (PORCINE) 5000 UNIT/ML IJ SOLN
5000.0000 [IU] | Freq: Once | INTRAMUSCULAR | Status: AC
  Administered 2023-11-28: 5000 [IU] via SUBCUTANEOUS
  Filled 2023-11-28: qty 1

## 2023-11-28 MED ORDER — POLYETHYLENE GLYCOL 3350 17 GM/SCOOP PO POWD: 238.0000 g | Freq: Once | ORAL | Status: DC

## 2023-11-28 MED ORDER — STERILE WATER FOR INJECTION IJ SOLN
INTRAMUSCULAR | Status: DC | PRN
  Administered 2023-11-28: 15 mL via INTRAVESICAL

## 2023-11-28 MED ORDER — DIPHENHYDRAMINE HCL 50 MG/ML IJ SOLN: 12.5000 mg | Freq: Four times a day (QID) | INTRAMUSCULAR | Status: DC | PRN

## 2023-11-28 MED ORDER — ENSURE PRE-SURGERY PO LIQD: 592.0000 mL | Freq: Once | ORAL | Status: DC

## 2023-11-28 MED ORDER — OXYCODONE HCL 5 MG/5ML PO SOLN: 5.0000 mg | Freq: Once | ORAL | Status: DC | PRN

## 2023-11-28 MED ORDER — ACETAMINOPHEN 500 MG PO TABS
1000.0000 mg | ORAL_TABLET | Freq: Four times a day (QID) | ORAL | Status: DC
  Administered 2023-11-28 – 2023-12-01 (×9): 1000 mg via ORAL
  Filled 2023-11-28 (×12): qty 2

## 2023-11-28 MED ORDER — NEOMYCIN SULFATE 500 MG PO TABS: 1000.0000 mg | ORAL_TABLET | ORAL | Status: DC

## 2023-11-28 MED ORDER — ACETAMINOPHEN 500 MG PO TABS
1000.0000 mg | ORAL_TABLET | ORAL | Status: AC
  Administered 2023-11-28: 1000 mg via ORAL
  Filled 2023-11-28: qty 2

## 2023-11-28 MED ORDER — ORAL CARE MOUTH RINSE: 15.0000 mL | Freq: Once | OROMUCOSAL | Status: AC

## 2023-11-28 MED ORDER — ENSURE PRE-SURGERY PO LIQD: 296.0000 mL | Freq: Once | ORAL | Status: DC

## 2023-11-28 MED ORDER — ROCURONIUM BROMIDE 10 MG/ML (PF) SYRINGE
PREFILLED_SYRINGE | INTRAVENOUS | Status: AC
  Filled 2023-11-28: qty 10

## 2023-11-28 MED ORDER — FENTANYL CITRATE PF 50 MCG/ML IJ SOSY: 25.0000 ug | PREFILLED_SYRINGE | INTRAMUSCULAR | Status: DC | PRN

## 2023-11-28 MED ORDER — FENTANYL CITRATE (PF) 100 MCG/2ML IJ SOLN
INTRAMUSCULAR | Status: AC
Start: 2023-11-28 — End: 2023-11-28
  Filled 2023-11-28: qty 2

## 2023-11-28 MED ORDER — ALPRAZOLAM 0.25 MG PO TABS: 0.2500 mg | ORAL_TABLET | Freq: Two times a day (BID) | ORAL | Status: DC | PRN

## 2023-11-28 SURGICAL SUPPLY — 98 items
ADAPTER GOLDBERG URETERAL (ADAPTER) IMPLANT
BAG COUNTER SPONGE SURGICOUNT (BAG) IMPLANT
BAG URO CATCHER STRL LF (MISCELLANEOUS) ×1 IMPLANT
BLADE EXTENDED COATED 6.5IN (ELECTRODE) ×1 IMPLANT
CANNULA REDUCER 12-8 DVNC XI (CANNULA) ×1 IMPLANT
CATH URETL OPEN 5X70 (CATHETERS) IMPLANT
CELLS DAT CNTRL 66122 CELL SVR (MISCELLANEOUS) IMPLANT
CHLORAPREP W/TINT 26 (MISCELLANEOUS) ×1 IMPLANT
CLIP APPLIE 5 13 M/L LIGAMAX5 (MISCELLANEOUS) IMPLANT
CLIP APPLIE ROT 10 11.4 M/L (STAPLE) IMPLANT
CLIP LIGATING HEM O LOK PURPLE (MISCELLANEOUS) IMPLANT
CLIP LIGATING HEMO O LOK GREEN (MISCELLANEOUS) IMPLANT
CLOTH BEACON ORANGE TIMEOUT ST (SAFETY) ×1 IMPLANT
COVER SURGICAL LIGHT HANDLE (MISCELLANEOUS) ×2 IMPLANT
COVER TIP SHEARS 8 DVNC (MISCELLANEOUS) ×1 IMPLANT
DEFOGGER SCOPE WARM SEASHARP (MISCELLANEOUS) ×2 IMPLANT
DERMABOND ADVANCED .7 DNX12 (GAUZE/BANDAGES/DRESSINGS) IMPLANT
DEVICE TROCAR PUNCTURE CLOSURE (ENDOMECHANICALS) IMPLANT
DRAIN CHANNEL 19F RND (DRAIN) ×1 IMPLANT
DRAPE ARM DVNC X/XI (DISPOSABLE) ×4 IMPLANT
DRAPE COLUMN DVNC XI (DISPOSABLE) ×1 IMPLANT
DRAPE CV SPLIT W-CLR ANES SCRN (DRAPES) ×1 IMPLANT
DRAPE PERI GROIN 82X75IN TIB (DRAPES) ×1 IMPLANT
DRAPE SURG IRRIG POUCH 19X23 (DRAPES) ×1 IMPLANT
DRIVER NDL LRG 8 DVNC XI (INSTRUMENTS) ×1 IMPLANT
DRIVER NDLE LRG 8 DVNC XI (INSTRUMENTS) ×1 IMPLANT
DRSG OPSITE POSTOP 4X10 (GAUZE/BANDAGES/DRESSINGS) IMPLANT
DRSG OPSITE POSTOP 4X6 (GAUZE/BANDAGES/DRESSINGS) IMPLANT
DRSG OPSITE POSTOP 4X8 (GAUZE/BANDAGES/DRESSINGS) IMPLANT
DRSG TEGADERM 2-3/8X2-3/4 SM (GAUZE/BANDAGES/DRESSINGS) ×5 IMPLANT
DRSG TEGADERM 4X4.75 (GAUZE/BANDAGES/DRESSINGS) ×1 IMPLANT
ELECT REM PT RETURN 15FT ADLT (MISCELLANEOUS) ×1 IMPLANT
ENDOLOOP SUT PDS II 0 18 (SUTURE) IMPLANT
EVACUATOR SILICONE 100CC (DRAIN) ×1 IMPLANT
GAUZE SPONGE 2X2 8PLY STRL LF (GAUZE/BANDAGES/DRESSINGS) ×1 IMPLANT
GAUZE SPONGE 4X4 12PLY STRL (GAUZE/BANDAGES/DRESSINGS) IMPLANT
GLOVE BIO SURGEON STRL SZ7.5 (GLOVE) ×3 IMPLANT
GLOVE BIO SURGEON STRL SZ8 (GLOVE) ×1 IMPLANT
GLOVE INDICATOR 8.0 STRL GRN (GLOVE) ×3 IMPLANT
GOWN SRG XL LVL 4 BRTHBL STRL (GOWNS) ×1 IMPLANT
GOWN STRL REUS W/ TWL XL LVL3 (GOWN DISPOSABLE) ×6 IMPLANT
GRASPER SUT TROCAR 14GX15 (MISCELLANEOUS) IMPLANT
GRASPER TIP-UP FEN DVNC XI (INSTRUMENTS) ×1 IMPLANT
GUIDEWIRE ANG ZIPWIRE 038X150 (WIRE) IMPLANT
GUIDEWIRE STR DUAL SENSOR (WIRE) IMPLANT
HOLDER FOLEY CATH W/STRAP (MISCELLANEOUS) ×1 IMPLANT
IRRIGATION SUCT STRKRFLW 2 WTP (MISCELLANEOUS) ×1 IMPLANT
KIT PROCEDURE DVNC SI (MISCELLANEOUS) ×1 IMPLANT
KIT TURNOVER KIT A (KITS) ×2 IMPLANT
MANIFOLD NEPTUNE II (INSTRUMENTS) ×1 IMPLANT
NDL INSUFFLATION 14GA 120MM (NEEDLE) ×1 IMPLANT
NEEDLE INSUFFLATION 14GA 120MM (NEEDLE) ×1 IMPLANT
PACK COLON (CUSTOM PROCEDURE TRAY) ×1 IMPLANT
PACK CYSTO (CUSTOM PROCEDURE TRAY) ×1 IMPLANT
PAD POSITIONING PINK XL (MISCELLANEOUS) ×1 IMPLANT
PENCIL SMOKE EVACUATOR (MISCELLANEOUS) IMPLANT
PROTECTOR NERVE ULNAR (MISCELLANEOUS) ×2 IMPLANT
RELOAD STAPLE 45 3.5 BLU DVNC (STAPLE) IMPLANT
RELOAD STAPLE 45 4.3 GRN DVNC (STAPLE) IMPLANT
RELOAD STAPLE 60 3.5 BLU DVNC (STAPLE) IMPLANT
RELOAD STAPLE 60 4.3 GRN DVNC (STAPLE) IMPLANT
RETRACTOR WND ALEXIS 18 MED (MISCELLANEOUS) IMPLANT
SCISSORS LAP 5X35 DISP (ENDOMECHANICALS) IMPLANT
SCISSORS MNPLR CVD DVNC XI (INSTRUMENTS) ×1 IMPLANT
SEAL UNIV 5-12 XI (MISCELLANEOUS) ×4 IMPLANT
SEALER VESSEL EXT DVNC XI (MISCELLANEOUS) ×1 IMPLANT
SLEEVE ADV FIXATION 5X100MM (TROCAR) IMPLANT
SOLUTION ELECTROSURG ANTI STCK (MISCELLANEOUS) ×1 IMPLANT
SPIKE FLUID TRANSFER (MISCELLANEOUS) ×1 IMPLANT
STAPLER 60 SUREFORM DVNC (STAPLE) IMPLANT
STAPLER ECHELON POWER CIR 29 (STAPLE) IMPLANT
STAPLER ECHELON POWER CIR 31 (STAPLE) IMPLANT
STOPCOCK 4 WAY LG BORE MALE ST (IV SETS) ×2 IMPLANT
SURGILUBE 2OZ TUBE FLIPTOP (MISCELLANEOUS) ×1 IMPLANT
SUT MNCRL AB 4-0 PS2 18 (SUTURE) ×1 IMPLANT
SUT PDS AB 1 CT1 27 (SUTURE) IMPLANT
SUT PDS AB 1 TP1 96 (SUTURE) IMPLANT
SUT PROLENE 0 CT 2 (SUTURE) IMPLANT
SUT PROLENE 2 0 KS (SUTURE) ×1 IMPLANT
SUT PROLENE 2 0 SH DA (SUTURE) IMPLANT
SUT SILK 2 0 SH CR/8 (SUTURE) IMPLANT
SUT SILK 2-0 18XBRD TIE 12 (SUTURE) IMPLANT
SUT SILK 3 0 SH CR/8 (SUTURE) ×1 IMPLANT
SUT SILK 3-0 18XBRD TIE 12 (SUTURE) ×1 IMPLANT
SUT VIC AB 3-0 SH 18 (SUTURE) IMPLANT
SUT VIC AB 3-0 SH 27XBRD (SUTURE) IMPLANT
SUT VICRYL 0 UR6 27IN ABS (SUTURE) ×1 IMPLANT
SUTURE V-LC BRB 180 2/0GR6GS22 (SUTURE) IMPLANT
SYR 10ML LL (SYRINGE) ×1 IMPLANT
SYSTEM LAPSCP GELPORT 120MM (MISCELLANEOUS) IMPLANT
SYSTEM WOUND ALEXIS 18CM MED (MISCELLANEOUS) ×1 IMPLANT
TAPE PAPER 3X10 WHT MICROPORE (GAUZE/BANDAGES/DRESSINGS) IMPLANT
TAPE UMBILICAL 1/8 X36 TWILL (MISCELLANEOUS) ×1 IMPLANT
TRAY FOLEY MTR SLVR 16FR STAT (SET/KITS/TRAYS/PACK) ×1 IMPLANT
TROCAR ADV FIXATION 5X100MM (TROCAR) ×1 IMPLANT
TUBING CONNECTING 10 (TUBING) ×4 IMPLANT
TUBING INSUFFLATION 10FT LAP (TUBING) ×1 IMPLANT
TUBING UROLOGY SET (TUBING) IMPLANT

## 2023-11-28 NOTE — Op Note (Addendum)
 Operative Note  Preoperative diagnosis:  1.  Diverticulitis  Postoperative diagnosis: 1.  Diverticulitis, fossa navicularis stricture   Procedure(s): 1.  Cystoscopy 2. Firefly instillation in bilateral ureters 3.  Bilateral retrograde pyelogram  4. Urethral dilation   Surgeon: Steffan Pea MD  Assistants:  None  Anesthesia:  General  Complications:  None  EBL:  None for my portion of the case  Specimens: 1. None for my portion of the case  Drains/Catheters: 1.  16 fr catheter   Intraoperative findings:   Bladder without suspicious bladder lesions. Urethral meatus dilated to 47fr using fleeta buren sounds   Retrograde pyelogram interpretation: Bilateral retrograde pyelograms demonstrating that firefly reach the renal collecting system and fill the entirety of the ureter.  No filling defects noted.   Indication:  Ostomy reversal    Description of procedure: The patient was identified and surgical site verification was performed prior to obtaining consent.  The patient was brought to the operating suite.  Under adequate general anesthesia the patient was positioned in dorsal lithotomy and prepped and draped.  A preoperative Time Out was performed addressing the anticipated surgical site, procedure, and safety precautions.  21 Fr cystoscope was attempted to be introduced into the urethra fossa navicularis stricture was encountered. The urethra was then dilated sequentially to 34fr using van buren's sounds.   The 21Fr cystoscope was inserted into the patient's urethral meatus and advanced to the bladder.   The bladder was inspected with the 30 degree lens.  The ureteral orifices were in their normal anatomic positions.  The bladder showed no trabeculation.  There were no bladder tumors noted.  The right orifice was intubated with a sensor wire and a 5 [place] open ended catheter was placed over the wire into the right ureter and advanced to 25 cm.  The firefly was mixed  with the contrast I then slowly pulled back and injected 7.5ml of the firefly contrast.  Retrograde pyelogram was taken demonstrating that the firefly had reached the renal pelvis.  I subsequently turned my attention to the patient's left ureteral orifice and performed a similar task, injecting 7.59ml of the firefly solution. in a retrograde fashion in the left ureter.   I then  placed a 16 [place] Foley.  The case was then turned over to Dr. Joleen team for the remainder of the procedure.  Dispo: - void trial can be completed in 3-4 days due to urethral dilation   Steffan Pea MD [facility]

## 2023-11-28 NOTE — Anesthesia Postprocedure Evaluation (Signed)
 Anesthesia Post Note  Patient: Wayne Lowe  Procedure(s) Performed: CLOSURE, COLOSTOMY, ROBOT-ASSISTED, BILATERAL TAP BLOCK LYSIS, ADHESIONS, ROBOT-ASSISTED, LAPAROSCOPIC SIGMOIDOSCOPY, FLEXIBLE CYSTOSCOPY WITH INDOCYANINE GREEN  IMAGING (ICG)     Patient location during evaluation: PACU Anesthesia Type: General Level of consciousness: awake Pain management: pain level controlled Vital Signs Assessment: post-procedure vital signs reviewed and stable Respiratory status: spontaneous breathing, nonlabored ventilation and respiratory function stable Cardiovascular status: blood pressure returned to baseline and stable Postop Assessment: no apparent nausea or vomiting Anesthetic complications: no   No notable events documented.  Last Vitals:  Vitals:   11/28/23 1434 11/28/23 1525  BP: 120/65 117/61  Pulse: (!) 53 73  Resp: 16 18  Temp: (!) 36.1 C 36.9 C  SpO2: 93% 100%    Last Pain:  Vitals:   11/28/23 1525  TempSrc: Oral  PainSc:                  Wayne Lowe

## 2023-11-28 NOTE — H&P (Signed)
 CC: Here today for surgery  HPI: Wayne Lowe is an 73 y.o. male with history of HTN, BPH, whom is seen in the office today for follow-up.  Flex sig 06/03/23 -  Fair preparation, nonthrombosed hemorrhoids, stool in rectum and rectosigmoid, stricture in sigmoid, no specimen  OR 06/04/23: 1. Exploratory laparotomy sigmoid colectomy and end colostomy (Hartman's procedure)  FINDINGS: Chronically inflamed appearing/fibrotic stricturing of the mid to distal sigmoid colon with associated proximal colonic dilation. There is also a fair amount of gas in the rectum. 1.2 L liquid stool evacuated from the colon proximal to the stricture.  PATH Diverticulosis with marked muscular hypertrophy and serosal fibrosis  Focal serosal foreign body type giant cells and pigmented macrophages suggestive of remote diverticulitis  Diverticuli present in proximal margin  Mucosal margins viable and free of inflammation   He reports he has a visit to see Dr. Burnette back with [facility] on 08/26/2023. Hopefully to have colonoscopy updated as well  INTERVAL HX GGE 08/07/23 Water -soluble contrast enema demonstrates normal filling of the rectal stump. No evidence of contrast extravasation.  He has been doing well. He denies any complaints. He is highly motivated to have the colostomy reversed if able for quality of life sited indications. He denies any abdominal pain, nausea, vomiting, or issues with his colostomy at present.  PMH: HTN, BPH, remote diverticulitis  PSH: As above  FHx: Denies any known family history of colorectal, breast, endometrial or ovarian cancer  Social Hx: Denies use of tobacco/EtOH/illicit drug. He is here today with his wife   He denies any changes in health or health history since we met in the office. No new medications/allergies. He states he is ready for surgery today.  Past Medical History:  Diagnosis Date   Anxiety    Arthritis    COVID-19    Diverticulitis    Dizziness    ED  (erectile dysfunction) of non-organic origin    Enlarged prostate without lower urinary tract symptoms (luts)    Fluctuating blood pressure    History of BPH    HTN (hypertension)    Hypercholesteremia    Impaired fasting glucose    Memory changes    Moderate major depression (HCC)    Recurrent low back pain    Restless leg    Sleep disorder    Tremor    TSH deficiency     Past Surgical History:  Procedure Laterality Date   FLEXIBLE SIGMOIDOSCOPY N/A 06/03/2023   Procedure: KINGSTON SIDE;  Surgeon: Kriss Estefana DEL, DO;  Location: [facility];  Service: Gastroenterology;  Laterality: N/A;   PARTIAL COLECTOMY N/A 06/04/2023   Procedure: Exploratory laparotomy with sigmoid colectomy and end colostomy (Hartmann's procedure);  Surgeon: Teresa Lonni HERO, MD;  Location: [facility];  Service: General;  Laterality: N/A;    History reviewed. No pertinent family history.  Social:  reports that he has quit smoking. His smoking use included cigarettes. He does not have any smokeless tobacco history on file. He reports that he does not drink alcohol  and does not use drugs.  Allergies:  Allergies  Allergen Reactions   Buspar [Buspirone]     EXACERBATED IBS    Crestor [Rosuvastatin]     BODY ACHES   Erythromycin     GI DISTRESS   Lipitor [Atorvastatin ]     JOINT PAIN    Lovastatin     MUSCLE ACHES    Pravachol [Pravastatin]     MM WEAKNESS   Wellbutrin [Bupropion] Other (  See Comments)    unknown reaction    Medications: I have reviewed the patient's current medications.  No results found for this or any previous visit (from the past 48 hours).  No results found.   PE Blood pressure (!) 146/74, pulse 60, temperature 98.1 F (36.7 C), temperature source Oral, resp. rate 16, height 5' 9.5 (1.765 m), weight 93.9 kg, SpO2 98%. Constitutional: NAD; conversant Eyes: Moist conjunctiva; no lid lag; anicteric Lungs: Normal respiratory effort CV: RRR GI: Abd soft,  NT/ND; no palpable hepatosplenomegaly Psychiatric: Appropriate affect  No results found for this or any previous visit (from the past 48 hours).  No results found.  A/P: Wayne Lowe is an 73 y.o. male with hx of HTN, HLD, BPH here for postop visit following ex-lap/Hartmann's procedure 3//25 for benign sigmoidal stricture.  - GGE clear 07/29/23 - Cscope clear by his report  -The anatomy and physiology of the GI tract was reviewed with the patient as a pertains to his current anatomy - We have again discussed that it is certainly possible to live a normal life with a colostomy and that reversal is an option for quality-of-life cited indications. He is highly motivated to have this reversed for those reasons. - We have discussed robotic assisted colostomy takedown, possible low anterior resection of remnant sigmoid, lysis of adhesions, flexible sigmoidoscopy; cystoscopy/ureteral ICG ([facility]) -The planned procedure, material risks (including, but not limited to, pain, bleeding, infection, scarring, need for blood transfusion, damage to surrounding structures- blood vessels/nerves/viscus/organs, damage to ureter, urine leak, leak from anastomosis, need for additional procedures, sexual dysfunction, low anterior resection syndrome (LARS) = increased fecal urgency and/or frequency, scenarios where a stoma may be necessary and where it may be permanent, worsening of pre-existing medical conditions, chronic diarrhea, constipation secondary to narcotic use, hernia, recurrence, pneumonia, heart attack, stroke, death) benefits and alternatives to surgery were discussed at length. The patient's questions were answered to his satisfaction, he voiced understanding and elected to proceed with surgery. Additionally, we discussed typical postoperative expectations and the recovery process.    Lonni Pizza, MD [facility]

## 2023-11-28 NOTE — Anesthesia Procedure Notes (Signed)
 Procedure Name: Intubation Date/Time: 11/28/2023 9:12 AM  Performed by: Delores Duwaine SAUNDERS, CRNAPre-anesthesia Checklist: Patient identified, Emergency Drugs available, Suction available and Patient being monitored Patient Re-evaluated:Patient Re-evaluated prior to induction Oxygen Delivery Method: Circle System Utilized Preoxygenation: Pre-oxygenation with 100% oxygen Induction Type: IV induction Ventilation: Mask ventilation without difficulty Laryngoscope Size: Mac and 4 Grade View: Grade I Tube type: Oral Tube size: 7.5 mm Number of attempts: 1 Airway Equipment and Method: Stylet and Oral airway Placement Confirmation: ETT inserted through vocal cords under direct vision, positive ETCO2 and breath sounds checked- equal and bilateral Secured at: 21 cm Tube secured with: Tape Dental Injury: Teeth and Oropharynx as per pre-operative assessment

## 2023-11-28 NOTE — Transfer of Care (Signed)
 Immediate Anesthesia Transfer of Care Note  Patient: Wayne Lowe  Procedure(s) Performed: CLOSURE, COLOSTOMY, ROBOT-ASSISTED, BILATERAL TAP BLOCK LYSIS, ADHESIONS, ROBOT-ASSISTED, LAPAROSCOPIC SIGMOIDOSCOPY, FLEXIBLE CYSTOSCOPY WITH INDOCYANINE GREEN  IMAGING (ICG)  Patient Location: PACU  Anesthesia Type:General  Level of Consciousness: drowsy  Airway & Oxygen Therapy: Patient Spontanous Breathing and Patient connected to face mask  Post-op Assessment: Report given to RN  Post vital signs: Reviewed and stable  Last Vitals:  Vitals Value Taken Time  BP 130/61 11/28/23 12:18  Temp    Pulse 58 11/28/23 12:21  Resp 18 11/28/23 12:21  SpO2 90 % 11/28/23 12:21  Vitals shown include unfiled device data.  Last Pain:  Vitals:   11/28/23 0657  TempSrc: Oral  PainSc: 0-No pain         Complications: No notable events documented.

## 2023-11-28 NOTE — Op Note (Signed)
 PATIENT: Wayne Lowe  73 y.o. male  Patient Care Team: Koirala, Dibas, MD as PCP - General (Family Medicine)  PREOP DIAGNOSIS: COLOSTOMY STATUS  POSTOP DIAGNOSIS: COLOSTOMY STATUS  PROCEDURE:  Robotic assisted takedown of end descending colostomy with colorectal anastomosis Robotic lysis of adhesions x 80 minutes Flexible sigmoidoscopy Bilateral transversus abdominus plane (TAP) blocks  SURGEON: Lonni HERO. Kathrene Sinopoli, MD  ASSISTANT: Bernarda Ned, MD  An experienced assistant was required given the complexity of this procedure and the standard of surgical care. My assistant helped with exposure through counter tension, suctioning, ligation and retraction to better visualize the surgical field. My assistant expedited sewing during the case by following my sutures. Wherever I use the term we in the report, my assistant actively helped me with that portion of the procedure.   ANESTHESIA: General endotracheal  EBL: 30 mL Total I/O In: 1000 [I.V.:1000] Out: 30 [Blood:30]  DRAINS: none  SPECIMEN: Colostomy  COUNTS: Sponge, needle and instrument counts were reported correct x2  FINDINGS: Somewhat medialized right ureter.  Intra-abdominal adhesions consisting of omentum and small bowel throughout the lower abdomen.  Adhesiolysis was necessary to facilitate colostomy reversal.  Healthy rectal stump without any evident remnant sigmoid colon.  A well perfused, tension free, hemostatic, air tight 29 mm EEA colorectal anastomosis fashioned 16 cm from the anal verge by flexible sigmoidoscopy.   NARRATIVE: The patient was seen in the pre-op holding area. The risks, benefits, complications, treatment options, and expected outcomes were previously discussed with the patient. The patient agreed with the proposed plan and has signed the informed consent form. The patient was brought to the operating room by the surgical team, identified as Wayne Lowe, and the procedure verified. placed  supine on the operating table and SCD's were applied. General endotracheal anesthesia was induced without difficulty.  He was then positioned in the lithotomy position with Allen stirrups.  Pressure points were evaluated and padded.  A foley catheter was then placed by nursing under sterile conditions. Hair on the abdomen was clipped.  He was secured to the operating table.  Dr. Shane with [facility] scrubbed for his portion of the procedure.  Please refer to his note for details. The abdomen was then prepped and draped in the standard sterile fashion. A time out was completed and the above information confirmed and need for preoperative antibiotics.  An OG tube was placed by anesthesia and confirmed to be to suction.  At Palmer's point, a stab incision was created and the Veress needle was introduced into the peritoneal cavity on the first attempt.  Intraperitoneal location was confirmed by the aspiration and saline drop test.  Pneumoperitoneum was established to a maximum pressure of 15 mmHg using CO2.  Following this, the abdomen was marked for planned trocar sites.  Just to the right and cephalad to the umbilicus, an 8 mm incision was created and an 8 mm blunt tipped robotic trocar was cautiously placed into the peritoneal cavity.  The laparoscope was inserted and demonstrated no evidence of trocar site nor Veress needle site complications.  The Veress needle was removed.  Bilateral transversus abdominis plane blocks were then created using a dilute mixture of Exparel  with Marcaine .  3 additional 8 mm robotic trochars were placed under direct visualization roughly in a line extending from the right ASIS towards the left upper quadrant. The bladder was inspected and noted to be at/below the pubic symphysis.  Staying 3 fingerbreadths above the pubic symphysis, an incision was created and  the 12 mm robotic trocar inserted directed cephalad into the peritoneal cavity under direct visualization.  An  additional 5 mm assist port was placed in the right lateral abdomen under direct visualization.  The abdomen was surveyed and there was adhesions of both omentum and small bowel throughout much of the lower abdomen.  He was positioned in Trendelenburg with the left side tilted slightly up.  Small bowel was carefully retracted out of the pelvis.  The robot was then docked and I went to the console.  We began with adhesiolysis. Adhesions consisting of omentum were first taken down sharply using robotic scissors.  Subsequently, we began with adhesiolysis of the small bowel.  Small bowel containing lesions are taken down from the colon going to the abdominal wall, attachments of the mesocolon to the small bowel were also lysed.  We also lysed adhesions of the small bowel across the lower midline abdomen and in both the right and left lower quadrants.  We then carefully inspected all surfaces of the small bowel to ensure no injuries and no serosal or full-thickness defects are found.  This was done twice.   The rectal stump was readily identified in the pelvis.  It is healthy in appearance.  At the location of the previous staple line, the tinea have already splayed and there are no appendices epiploica.  This is felt to be clinically consistent with the proximal rectum. The left and right ureters were identified with the aid of near-infrared fluorescence imaging and protected free of injury.  The proximal and mid rectum were then mobilized by identifying our TME plane between the fascia propria the rectum and the presacral fascia.  The proximal rectum is mobilized.  Again we are able to identify both the left and right ureters and stay well away from them.  The right ureter does appear somewhat medialized.  Attention was then directed at the colostomy.  We were able to free all adhesions from around the colostomy sharply using scissors.  The descending colon had been fully mobilized up to the level of the splenic  flexure.  I then scrubbed back in.  We turned our attention to taking down the colostomy.  The colostomy was circumferentially incised.  The colon and associated mesocolon was carefully dissected free from the surrounding tissue using Metzenbaum scissors.  The colon was then inspected and there was no apparent injury to the mesentery or serosa.  A pursestring device was then applied to the colostomy.  A pursestring consisting of 2-0 Prolene was placed.  The colostomy was then excised and passed off the specimen.  EEA sizers were passed and a 29 mm EEA stapler selected.  Belt loops consisting of 3-0 silk were placed around the pursestring suture.  The anvil was placed on the pursestring tied.  A small amount of fat was cleared to facilitate an appropriate anastomosis.  There are no apparent diverticula within the planned anastomosis.  There is a palpable pulse in the mesentery going all the way out to the planned anastomosis.  This is then placed back into the abdomen.  An Alexis wound protector with an associated cap is placed.  Pneumoperitoneum is reestablished.  The colon with the anvil easily reaches into the deep pelvis without any tension.    I then went below to pass the stapler.  My partner remained above.  EEA sizers were cautiously introduced via the anus and advanced under direct visualization.  The EEA stapler was passed and the spike deployed just  anterior to the staple line.  The components were then mated.  Orientation was confirmed such that there is no twisting of the colon nor small bowel underneath the mesenteric defect. Care was taken to ensure no other structures were incorporated within this either.  The stapler was then closed, held, and fired. This was then removed. The donuts were inspected and noted to be complete.  The colon proximal to the anastomosis was then gently occluded. The pelvis was filled with sterile irrigation. Under direct visualization, I passed a flexible sigmoidoscope.   The anastomosis was under water .  With good distention of the anastomosis there was no air leak. The anastomosis pink in appearance.  This is located at 16 cm from the anal verge by flexible sigmoidoscopy.  It is hemostatic.  Additionally, looking from above, there is no tension on the colon or mesentery.  Sigmoidoscope was withdrawn.  Irrigation was evacuated from the pelvis.  The abdomen and pelvis are surveyed and noted to be completely hemostatic without any apparent injury.  Under direct visualization, all trochars are removed.  The Alexis wound protector was removed. I scrubbed back in.  The fascia at the previous colostomy site is then closed using 2 running #1 PDS sutures in a longitudinal manner as this results in a tension-free closure.  The fascia is palpated and noted to be completely closed.  A 0 Vicryl suture was used to create a pursestring at the level of the skin.  This is cinched down to a fingerbreadth in diameter.  This is then wicked with a moist kerlex.  All sponge, needle, and instrument counts were reported correct x2.  The skin of all other incisions was closed with 4-0 Monocryl subcuticular suture. Dermabond was placed over all incisions.  The former colostomy site was dressed with 4 x 4's and tape.   He was then taken out of the lithotomy position, awakened from anesthesia, extubated, and transferred to a stretcher for transport to PACU in satisfactory condition having tolerated the procedure well.

## 2023-11-28 NOTE — Plan of Care (Signed)
  Problem: Education: Goal: Understanding of discharge needs will improve Outcome: Progressing Goal: Verbalization of understanding of the causes of altered bowel function will improve Outcome: Progressing   Problem: Activity: Goal: Ability to tolerate increased activity will improve Outcome: Progressing

## 2023-11-29 ENCOUNTER — Encounter (HOSPITAL_COMMUNITY): Payer: Self-pay | Admitting: Surgery

## 2023-11-29 LAB — CBC
HCT: 41.3 % (ref 39.0–52.0)
Hemoglobin: 13.5 g/dL (ref 13.0–17.0)
MCH: 30.1 pg (ref 26.0–34.0)
MCHC: 32.7 g/dL (ref 30.0–36.0)
MCV: 92.2 fL (ref 80.0–100.0)
Platelets: 215 K/uL (ref 150–400)
RBC: 4.48 MIL/uL (ref 4.22–5.81)
RDW: 14.2 % (ref 11.5–15.5)
WBC: 11.7 K/uL — ABNORMAL HIGH (ref 4.0–10.5)
nRBC: 0 % (ref 0.0–0.2)

## 2023-11-29 LAB — BASIC METABOLIC PANEL WITH GFR
Anion gap: 13 (ref 5–15)
BUN: 18 mg/dL (ref 8–23)
CO2: 18 mmol/L — ABNORMAL LOW (ref 22–32)
Calcium: 8.7 mg/dL — ABNORMAL LOW (ref 8.9–10.3)
Chloride: 105 mmol/L (ref 98–111)
Creatinine, Ser: 0.98 mg/dL (ref 0.61–1.24)
GFR, Estimated: >60 mL/min (ref >60)
Glucose, Bld: 119 mg/dL — ABNORMAL HIGH (ref 70–99)
Potassium: 4.3 mmol/L (ref 3.5–5.1)
Sodium: 136 mmol/L (ref 135–145)

## 2023-11-29 LAB — SURGICAL PATHOLOGY

## 2023-11-29 MED ORDER — OXYCODONE HCL 5 MG PO TABS: 5.0000 mg | ORAL_TABLET | Freq: Four times a day (QID) | ORAL | 0 refills | Status: AC | PRN

## 2023-11-29 MED ORDER — CHLORHEXIDINE GLUCONATE CLOTH 2 % EX PADS
6.0000 | MEDICATED_PAD | Freq: Every day | CUTANEOUS | Status: DC
  Administered 2023-11-29 – 2023-11-30 (×2): 6 via TOPICAL

## 2023-11-29 NOTE — Discharge Instructions (Signed)
 POST OP INSTRUCTIONS AFTER COLON SURGERY  DIET: Be sure to include lots of fluids daily to stay hydrated - 64oz of water  per day (8, 8 oz glasses).  Avoid fast food or heavy meals for the first couple of weeks as your are more likely to get nauseated. Avoid raw/uncooked fruits or vegetables for the first 4 weeks (its ok to have these if they are blended into smoothie form). If you have fruits/vegetables, make sure they are cooked until soft enough to mash on the roof of your mouth and chew your food well. Otherwise, diet as tolerated.  Take your usually prescribed home medications unless otherwise directed.  PAIN CONTROL: Pain is best controlled by a usual combination of three different methods TOGETHER: Ice/Heat Over the counter pain medication Prescription pain medication Most patients will experience some swelling and bruising around the surgical site.  Ice packs or heating pads (30-60 minutes up to 6 times a day) will help. Some people prefer to use ice alone, heat alone, alternating between ice & heat.  Experiment to what works for you.  Swelling and bruising can take several weeks to resolve.   It is helpful to take an over-the-counter pain medication regularly for the first few weeks: Ibuprofen  (Motrin /Advil ) - 200mg  tabs - take 3 tabs (600mg ) every 6 hours as needed for pain (unless you have been directed previously to avoid NSAIDs/ibuprofen ) Acetaminophen  (Tylenol ) - you may take 650mg  every 6 hours as needed. You can take this with motrin  as they act differently on the body. If you are taking a narcotic pain medication that has acetaminophen  in it, do not take over the counter tylenol  at the same time. NOTE: You may take both of these medications together - most patients  find it most helpful when alternating between the two (i.e. Ibuprofen  at 6am, tylenol  at 9am, ibuprofen  at 12pm ..SABRA) A  prescription for pain medication should be given to you upon discharge.  Take your pain medication as  prescribed if your pain is not adequatly controlled with the over-the-counter pain reliefs mentioned above.  Avoid getting constipated.  Between the surgery and the pain medications, it is common to experience some constipation.  Increasing fluid intake and taking a fiber supplement (such as Metamucil, Citrucel, FiberCon, MiraLax , etc) 1-2 times a day regularly will usually help prevent this problem from occurring.  A mild laxative (prune juice, Milk of Magnesia, MiraLax , etc) should be taken according to package directions if there are no bowel movements after 48 hours.    Dressing: Former colostomy site-cover with clean dry gauze and change daily.  Keeping this covered helps protect any sort of drainage from getting on your clothing.  Your other incisions are covered in Dermabond which is like sterile superglue for the skin. This will come off on it's own in a couple weeks. It is waterproof and you may bathe normally starting the day after your surgery in a shower. Avoid baths/pools/lakes/oceans until your wounds have fully healed.  ACTIVITIES as tolerated:   Avoid heavy lifting (>10lbs or 1 gallon of milk) for the next 6 weeks. You may resume regular daily activities as tolerated--such as daily self-care, walking, climbing stairs--gradually increasing activities as tolerated.  If you can walk 30 minutes without difficulty, it is safe to try more intense activity such as jogging, treadmill, bicycling, low-impact aerobics.  DO NOT PUSH THROUGH PAIN.  Let pain be your guide: If it hurts to do something, don't do it. You may drive when you are  no longer taking prescription pain medication, you can comfortably wear a seatbelt, and you can safely maneuver your car and apply brakes.  FOLLOW UP in our office Please call [facility] at [phone] to set up an appointment to see your surgeon in the office for a follow-up appointment approximately 2 weeks after your surgery. Make sure that you call for this  appointment the day you arrive home to insure a convenient appointment time.  9. If you have disability or family leave forms that need to be completed, you may have them completed by your primary care physician's office; for return to work instructions, please ask our office staff and they will be happy to assist you in obtaining this documentation   When to call us  [phone]: Poor pain control Reactions / problems with new medications (rash/itching, etc)  Fever over 101.5 F (38.5 C) Inability to urinate Nausea/vomiting Worsening swelling or bruising Continued bleeding from incision. Increased pain, redness, or drainage from the incision  The clinic staff is available to answer your questions during regular business hours (8:30am-5pm).  Please don't hesitate to call and ask to speak to one of our nurses for clinical concerns.   A surgeon from [facility] is always on call at the hospitals   If you have a medical emergency, go to the nearest emergency room or call 911.  [facility] [address], [place], [place]  [address] MAIN: [phone] FAX: [phone] [email or web address]

## 2023-11-29 NOTE — Progress Notes (Signed)
   11/29/23 1611  TOC Brief Assessment  Insurance and Status Reviewed (BLUE CROSS BLUE SHIELD MEDICARE / BCBS MEDICARE)  Patient has primary care physician Yes (Koirala, Dibas, MD)  Home environment has been reviewed Single family home with spouse  Prior level of function: Independent with ADL's  Prior/Current Home Services No current home services  Social Drivers of Health Review SDOH reviewed no interventions necessary  Readmission risk has been reviewed Yes  Transition of care needs transition of care needs identified, TOC will continue to follow

## 2023-11-29 NOTE — Plan of Care (Signed)
  Problem: Activity: Goal: Ability to tolerate increased activity will improve Outcome: Progressing   Problem: Pain Managment: Goal: General experience of comfort will improve and/or be controlled Outcome: Progressing   Problem: Safety: Goal: Ability to remain free from injury will improve Outcome: Progressing

## 2023-11-29 NOTE — Progress Notes (Signed)
  Subjective No acute events. Feeling reasonably well. No n/v. Tolerating plenty of clears. No complaints at present  Objective: Vital signs in last 24 hours: Temp:  [96.5 F (35.8 C)-98.5 F (36.9 C)] 98 F (36.7 C) (08/29 0647) Pulse Rate:  [52-73] 56 (08/29 0647) Resp:  [15-19] 18 (08/29 0647) BP: (105-160)/(54-81) 119/81 (08/29 0647) SpO2:  [90 %-100 %] 95 % (08/29 0647) Last BM Date : 11/28/23  Intake/Output from previous day: 08/28 0701 - 08/29 0700 In: 1486.5 [P.O.:120; I.V.:1266.5; IV Piggyback:100] Out: 430 [Urine:400; Blood:30] Intake/Output this shift: No intake/output data recorded.  Gen: NAD, comfortable CV: RRR Pulm: Normal work of breathing Abd: Soft, appropriate incisional soreness, nondistended. Incisions c/d/I Ext: SCDs in place  Lab Results: CBC  Recent Labs    11/29/23 0456  WBC 11.7*  HGB 13.5  HCT 41.3  PLT 215   BMET Recent Labs    11/29/23 0456  NA 136  K 4.3  CL 105  CO2 18*  GLUCOSE 119*  BUN 18  CREATININE 0.98  CALCIUM  8.7*   PT/INR No results for input(s): LABPROT, INR in the last 72 hours. ABG No results for input(s): PHART, HCO3 in the last 72 hours.  Invalid input(s): PCO2, PO2  Studies/Results:  Anti-infectives: Anti-infectives (From admission, onward)    Start     Dose/Rate Route Frequency Ordered Stop   11/28/23 1400  neomycin  (MYCIFRADIN ) tablet 1,000 mg  Status:  Discontinued       Placed in And Linked Group   1,000 mg Oral 3 times per day 11/28/23 0648 11/28/23 0653   11/28/23 1400  metroNIDAZOLE  (FLAGYL ) tablet 1,000 mg  Status:  Discontinued       Placed in And Linked Group   1,000 mg Oral 3 times per day 11/28/23 0648 11/28/23 0653   11/28/23 0700  cefoTEtan  (CEFOTAN ) 2 g in sodium chloride  0.9 % 100 mL IVPB        2 g 200 mL/hr over 30 Minutes Intravenous On call to O.R. 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 Discussed expectations postop as well as wound care. Follow-up arranged in our office   LOS: 1 day    Lonni Pizza, MD [facility]

## 2023-11-30 LAB — BASIC METABOLIC PANEL WITH GFR
Anion gap: 12 (ref 5–15)
BUN: 13 mg/dL (ref 8–23)
CO2: 17 mmol/L — ABNORMAL LOW (ref 22–32)
Calcium: 8.6 mg/dL — ABNORMAL LOW (ref 8.9–10.3)
Chloride: 108 mmol/L (ref 98–111)
Creatinine, Ser: 0.69 mg/dL (ref 0.61–1.24)
GFR, Estimated: >60 mL/min (ref >60)
Glucose, Bld: 123 mg/dL — ABNORMAL HIGH (ref 70–99)
Potassium: 4.2 mmol/L (ref 3.5–5.1)
Sodium: 137 mmol/L (ref 135–145)

## 2023-11-30 LAB — CBC
HCT: 41.7 % (ref 39.0–52.0)
Hemoglobin: 13.5 g/dL (ref 13.0–17.0)
MCH: 29.6 pg (ref 26.0–34.0)
MCHC: 32.4 g/dL (ref 30.0–36.0)
MCV: 91.4 fL (ref 80.0–100.0)
Platelets: 180 K/uL (ref 150–400)
RBC: 4.56 MIL/uL (ref 4.22–5.81)
RDW: 14.7 % (ref 11.5–15.5)
WBC: 9.3 K/uL (ref 4.0–10.5)
nRBC: 0 % (ref 0.0–0.2)

## 2023-11-30 NOTE — Progress Notes (Signed)
 2 Days Post-Op   Subjective/Chief Complaint: Feels bloated, no flatus/bm yet, ambulating   Objective: Vital signs in last 24 hours: Temp:  [97.4 F (36.3 C)-98.6 F (37 C)] 98.6 F (37 C) (08/30 0518) Pulse Rate:  [52-58] 58 (08/30 0518) Resp:  [16-18] 18 (08/30 0518) BP: (153-174)/(67-74) 167/73 (08/30 0518) SpO2:  [93 %-96 %] 93 % (08/30 0518) Weight:  [93.8 kg] 93.8 kg (08/30 0500) Last BM Date : 11/28/23  Intake/Output from previous day: 08/29 0701 - 08/30 0700 In: 1200 [P.O.:1200] Out: 2650 [Urine:2650] Intake/Output this shift: No intake/output data recorded.  General nad in chair Pulm effort normal Ab mild distended approp tender, colostomy site with packing removed today no infection, other incisions clean Catheter in place  Lab Results:  Recent Labs    11/29/23 0456  WBC 11.7*  HGB 13.5  HCT 41.3  PLT 215   BMET Recent Labs    11/29/23 0456  NA 136  K 4.3  CL 105  CO2 18*  GLUCOSE 119*  BUN 18  CREATININE 0.98  CALCIUM  8.7*   PT/INR No results for input(s): LABPROT, INR in the last 72 hours. ABG No results for input(s): PHART, HCO3 in the last 72 hours.  Invalid input(s): PCO2, PO2  Studies/Results: DG C-Arm 1-60 Min-No Report Result Date: 11/28/2023 Fluoroscopy was utilized by the requesting physician.  No radiographic interpretation.    Anti-infectives: Anti-infectives (From admission, onward)    Start     Dose/Rate Route Frequency Ordered Stop   11/28/23 1400  neomycin  (MYCIFRADIN ) tablet 1,000 mg  Status:  Discontinued       Placed in And Linked Group   1,000 mg Oral 3 times per day 11/28/23 0648 11/28/23 0653   11/28/23 1400  metroNIDAZOLE  (FLAGYL ) tablet 1,000 mg  Status:  Discontinued       Placed in And Linked Group   1,000 mg Oral 3 times per day 11/28/23 0648 11/28/23 0653   11/28/23 0700  cefoTEtan  (CEFOTAN ) 2 g in sodium chloride  0.9 % 100 mL IVPB        2 g 200 mL/hr over 30 Minutes Intravenous On call  to O.R. 11/28/23 0648 11/28/23 1439       Assessment/Plan: POD 2 colostomy takedown, robotic with loa, meatal stricture with foley -with bloating appears to have mild ileus, will do fulls as discussed today, await bowel function, encouraged oob and ambulation which he is doing -foley out tomorrow per urology -home meds -sq heparin     Donnice Bury 11/30/2023

## 2023-11-30 NOTE — Plan of Care (Signed)
   Problem: Education: Goal: Understanding of discharge needs will improve Outcome: Progressing   Problem: Activity: Goal: Ability to tolerate increased activity will improve Outcome: Progressing

## 2023-12-01 NOTE — Plan of Care (Signed)
   Problem: Activity: Goal: Ability to tolerate increased activity will improve Outcome: Progressing

## 2023-12-01 NOTE — Progress Notes (Signed)
 3 Days Post-Op   Subjective/Chief Complaint: Had bm, flatus, voided after foley out already, wants to go home   Objective: Vital signs in last 24 hours: Temp:  [98.1 F (36.7 C)-98.8 F (37.1 C)] 98.1 F (36.7 C) (08/31 0641) Pulse Rate:  [64-67] 64 (08/31 0641) Resp:  [18] 18 (08/31 0641) BP: (152-175)/(72-84) 152/72 (08/31 0641) SpO2:  [95 %-97 %] 97 % (08/31 0641) Weight:  [95.7 kg-95.9 kg] 95.9 kg (08/31 0534) Last BM Date : 11/28/23  Intake/Output from previous day: 08/30 0701 - 08/31 0700 In: 650 [P.O.:650] Out: 2300 [Urine:2300] Intake/Output this shift: No intake/output data recorded.  General nad Pulm effort normal Cv regular Ab soft approp tender nondistended ostomy site without infection  Lab Results:  Recent Labs    11/29/23 0456 11/30/23 1357  WBC 11.7* 9.3  HGB 13.5 13.5  HCT 41.3 41.7  PLT 215 180   BMET Recent Labs    11/29/23 0456 11/30/23 1357  NA 136 137  K 4.3 4.2  CL 105 108  CO2 18* 17*  GLUCOSE 119* 123*  BUN 18 13  CREATININE 0.98 0.69  CALCIUM  8.7* 8.6*   PT/INR No results for input(s): LABPROT, INR in the last 72 hours. ABG No results for input(s): PHART, HCO3 in the last 72 hours.  Invalid input(s): PCO2, PO2  Studies/Results: No results found.  Anti-infectives: Anti-infectives (From admission, onward)    Start     Dose/Rate Route Frequency Ordered Stop   11/28/23 1400  neomycin  (MYCIFRADIN ) tablet 1,000 mg  Status:  Discontinued       Placed in And Linked Group   1,000 mg Oral 3 times per day 11/28/23 0648 11/28/23 0653   11/28/23 1400  metroNIDAZOLE  (FLAGYL ) tablet 1,000 mg  Status:  Discontinued       Placed in And Linked Group   1,000 mg Oral 3 times per day 11/28/23 9351 11/28/23 0653   11/28/23 0700  cefoTEtan  (CEFOTAN ) 2 g in sodium chloride  0.9 % 100 mL IVPB        2 g 200 mL/hr over 30 Minutes Intravenous On call to O.R. 11/28/23 0648 11/28/23 1439       Assessment/Plan: POD 3  colostomy takedown, robotic with loa, meatal stricture with foley -foley out and voiding -has return of bowel function, if tolerates diet can go home today -home meds -sq heparin     Donnice Bury 12/01/2023

## 2023-12-04 NOTE — Discharge Summary (Signed)
 Patient ID: JAQUISE FAUX MRN: [id number] DOB/AGE: [birth year] 74 y.o.  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 His diet was gradually advanced which he tolerated well. He began having spontaneous bowel function. Pain is well controlled.  He was seen by my partner, Dr. Ebbie, and subsequently discharged 12/01/2023.  Please refer to his note for details.    Allergies as of 12/01/2023       Reactions   Buspar [buspirone]    EXACERBATED IBS   Crestor [rosuvastatin]    BODY ACHES   Erythromycin    GI DISTRESS   Lipitor [atorvastatin ]    JOINT PAIN   Lovastatin    MUSCLE ACHES   Pravachol [pravastatin]    MM WEAKNESS   Wellbutrin [bupropion] Other (See Comments)   unknown reaction        Medication List     STOP taking these medications    metroNIDAZOLE  500 MG tablet Commonly known as: FLAGYL    neomycin  500 MG tablet Commonly known as: MYCIFRADIN    polyethylene glycol-electrolytes 420 g solution Commonly known as: NuLYTELY        TAKE these medications    acetaminophen  500 MG tablet Commonly known as: TYLENOL  Take 500 mg by mouth every 6 (six) hours as needed (pain.).   ALPRAZolam  0.25 MG tablet Commonly known as: XANAX  Take 0.25 mg by mouth 2 (two) times daily as needed for anxiety.   amLODipine  5 MG tablet Commonly known as: NORVASC  Take 5 mg by  mouth in the morning.   atorvastatin  20 MG tablet Commonly known as: LIPITOR Take 20 mg by mouth at bedtime.   BOOST DIABETIC PO Take 237 mLs by mouth 2 (two) times a week.   carboxymethylcellulose 0.5 % Soln Commonly known as: REFRESH PLUS Place 1 drop into both eyes 3 (three) times daily as needed (dry/irritated eyes.).   finasteride  5 MG tablet Commonly known as: PROSCAR  Take 5 mg by mouth at bedtime.   FLUoxetine  40 MG capsule Commonly known as: PROZAC  Take 80 mg by mouth every morning.   ibuprofen  200 MG tablet Commonly known as: ADVIL  Take 200 mg by mouth every 8 (eight) hours as needed (pain.).   oxyCODONE  5 MG immediate release tablet Commonly known as: Roxicodone  Take 1 tablet (5 mg total) by mouth every 6 (six) hours as needed for up to 5 days (Postop pain not controlled with Tylenol /ibuprofen  first).   tamsulosin  0.4 MG Caps capsule Commonly known as: FLOMAX  Take 0.8 mg by mouth in the morning.   traZODone  100 MG tablet Commonly known as: DESYREL  Take 100 mg by mouth at bedtime.   triamcinolone  cream 0.1 % Commonly known as: KENALOG Apply 1 Application topically 2 (two) times daily as needed (skin irritation).          Follow-up Information     Teresa Lonni HERO, MD Follow up on 12/16/2023.   Specialties: General Surgery, Colon and Rectal Surgery Why: Please arrive by 1:40 PM Contact information: [address] [place] [address] [phone]                 Lonni HERO. Teresa, M.D. [facility]

## 2023-12-24 DIAGNOSIS — M25511 Pain in right shoulder: Secondary | ICD-10-CM | POA: Diagnosis not present

## 2023-12-31 DIAGNOSIS — M25511 Pain in right shoulder: Secondary | ICD-10-CM | POA: Diagnosis not present

## 2024-01-09 DIAGNOSIS — M7581 Other shoulder lesions, right shoulder: Secondary | ICD-10-CM | POA: Diagnosis not present

## 2024-01-09 DIAGNOSIS — M19011 Primary osteoarthritis, right shoulder: Secondary | ICD-10-CM | POA: Diagnosis not present

## 2024-01-09 DIAGNOSIS — M75111 Incomplete rotator cuff tear or rupture of right shoulder, not specified as traumatic: Secondary | ICD-10-CM | POA: Diagnosis not present

## 2024-01-09 DIAGNOSIS — M25531 Pain in right wrist: Secondary | ICD-10-CM | POA: Diagnosis not present
